# Patient Record
Sex: Female | Born: 1955 | Race: White | Hispanic: No | Marital: Married | Smoking: Never smoker
Health system: Southern US, Community
[De-identification: ages and names within clinical notes are randomized; demographics above are authoritative.]

## PROBLEM LIST (undated history)

## (undated) DIAGNOSIS — J309 Allergic rhinitis, unspecified: Secondary | ICD-10-CM

## (undated) DIAGNOSIS — I10 Essential (primary) hypertension: Secondary | ICD-10-CM

## (undated) DIAGNOSIS — J302 Other seasonal allergic rhinitis: Secondary | ICD-10-CM

## (undated) DIAGNOSIS — Z8639 Personal history of other endocrine, nutritional and metabolic disease: Secondary | ICD-10-CM

## (undated) DIAGNOSIS — E039 Hypothyroidism, unspecified: Secondary | ICD-10-CM

## (undated) HISTORY — PX: TUBAL LIGATION: SHX77

## (undated) HISTORY — PX: DILATION AND CURETTAGE OF UTERUS: SHX78

## (undated) HISTORY — DX: Personal history of other endocrine, nutritional and metabolic disease: Z86.39

## (undated) HISTORY — PX: COLONOSCOPY: SHX174

## (undated) HISTORY — PX: ESOPHAGOGASTRODUODENOSCOPY: SHX1529

## (undated) HISTORY — DX: Hypothyroidism, unspecified: E03.9

## (undated) HISTORY — DX: Allergic rhinitis, unspecified: J30.9

## (undated) HISTORY — PX: ENDOMETRIAL ABLATION: SHX621

## (undated) HISTORY — PX: TONSILLECTOMY AND ADENOIDECTOMY: SUR1326

## (undated) HISTORY — PX: THYROID SURGERY: SHX805

---

## 2010-08-29 ENCOUNTER — Other Ambulatory Visit (HOSPITAL_COMMUNITY): Payer: Self-pay | Admitting: Family Medicine

## 2010-08-29 DIAGNOSIS — Z139 Encounter for screening, unspecified: Secondary | ICD-10-CM

## 2010-09-02 ENCOUNTER — Ambulatory Visit (HOSPITAL_COMMUNITY)
Admission: RE | Admit: 2010-09-02 | Discharge: 2010-09-02 | Disposition: A | Discharge status: Home / Self Care | Payer: BC Managed Care – PPO | Source: Ambulatory Visit | Attending: Family Medicine | Admitting: Family Medicine

## 2010-09-02 DIAGNOSIS — Z1231 Encounter for screening mammogram for malignant neoplasm of breast: Secondary | ICD-10-CM | POA: Insufficient documentation

## 2010-09-02 DIAGNOSIS — Z139 Encounter for screening, unspecified: Secondary | ICD-10-CM

## 2011-09-12 ENCOUNTER — Other Ambulatory Visit: Payer: Self-pay | Admitting: Family Medicine

## 2011-09-12 DIAGNOSIS — Z139 Encounter for screening, unspecified: Secondary | ICD-10-CM

## 2011-09-18 ENCOUNTER — Ambulatory Visit (HOSPITAL_COMMUNITY)
Admission: RE | Admit: 2011-09-18 | Discharge: 2011-09-18 | Disposition: A | Discharge status: Home / Self Care | Payer: BC Managed Care – PPO | Source: Ambulatory Visit | Attending: Family Medicine | Admitting: Family Medicine

## 2011-09-18 DIAGNOSIS — Z1231 Encounter for screening mammogram for malignant neoplasm of breast: Secondary | ICD-10-CM | POA: Insufficient documentation

## 2011-09-18 DIAGNOSIS — Z139 Encounter for screening, unspecified: Secondary | ICD-10-CM

## 2011-09-18 DIAGNOSIS — N6459 Other signs and symptoms in breast: Secondary | ICD-10-CM | POA: Insufficient documentation

## 2011-09-23 ENCOUNTER — Other Ambulatory Visit: Payer: Self-pay | Admitting: Family Medicine

## 2011-09-23 DIAGNOSIS — R928 Other abnormal and inconclusive findings on diagnostic imaging of breast: Secondary | ICD-10-CM

## 2011-10-08 ENCOUNTER — Encounter (HOSPITAL_COMMUNITY): Payer: BC Managed Care – PPO

## 2011-10-15 ENCOUNTER — Ambulatory Visit (HOSPITAL_COMMUNITY)
Admission: RE | Admit: 2011-10-15 | Discharge: 2011-10-15 | Disposition: A | Discharge status: Home / Self Care | Payer: BC Managed Care – PPO | Source: Ambulatory Visit | Attending: Family Medicine | Admitting: Family Medicine

## 2011-10-15 DIAGNOSIS — R928 Other abnormal and inconclusive findings on diagnostic imaging of breast: Secondary | ICD-10-CM | POA: Insufficient documentation

## 2012-09-30 ENCOUNTER — Encounter: Payer: Self-pay | Admitting: Family Medicine

## 2012-09-30 ENCOUNTER — Ambulatory Visit (INDEPENDENT_AMBULATORY_CARE_PROVIDER_SITE_OTHER): Payer: BC Managed Care – PPO | Admitting: Family Medicine

## 2012-09-30 VITALS — Temp 98.6°F | Wt 155.0 lb

## 2012-09-30 DIAGNOSIS — B9789 Other viral agents as the cause of diseases classified elsewhere: Secondary | ICD-10-CM

## 2012-09-30 DIAGNOSIS — B349 Viral infection, unspecified: Secondary | ICD-10-CM

## 2012-09-30 DIAGNOSIS — R300 Dysuria: Secondary | ICD-10-CM

## 2012-09-30 LAB — POCT URINALYSIS DIPSTICK
Spec Grav, UA: 1.01
pH, UA: 7.5

## 2012-09-30 MED ORDER — CIPROFLOXACIN HCL 500 MG PO TABS: 500.0000 mg | ORAL_TABLET | Freq: Two times a day (BID) | ORAL | Status: AC

## 2012-09-30 NOTE — Progress Notes (Signed)
  Subjective:    Patient ID: Katie Ingram, female    DOB: 1955/11/07, 57 y.o.   MRN: [id number]  HPI This patient relates that she has not felt well the past few days has noticed some urinary frequency denies dysuria denies hematuria denies vomiting diarrhea she states started off more as a upper respiratory that she thought was due to allergies slight sinus pressure but nothing severe she denies sweats chills vomiting  PMH, family history of benign   Review of Systemstheplease see above     Objective:   Physical Exam Vital signs noted. No fever. Lungs are clear heart is regular abdomen soft flanks nontender skin warm dry urinalysis negative microscopic negative       Assessment & Plan:  Viral syndrome-I doubt UTI. Her specimen was. Small the dipstick was negative not enough to do a culture. I did give her a prescription of Cipro 500 twice a day for 5 days if she has ongoing symptoms of a UTI if fevers vomiting bloody stools or other problems immediately call us. Allergic rhinitis use current medications that seem to be doing an adequate job.

## 2012-10-06 ENCOUNTER — Other Ambulatory Visit: Payer: Self-pay | Admitting: Family Medicine

## 2012-11-06 ENCOUNTER — Encounter: Payer: Self-pay | Admitting: *Deleted

## 2012-11-08 ENCOUNTER — Ambulatory Visit (INDEPENDENT_AMBULATORY_CARE_PROVIDER_SITE_OTHER): Payer: BC Managed Care – PPO | Admitting: Nurse Practitioner

## 2012-11-08 ENCOUNTER — Other Ambulatory Visit: Payer: Self-pay | Admitting: Family Medicine

## 2012-11-08 ENCOUNTER — Encounter: Payer: Self-pay | Admitting: Nurse Practitioner

## 2012-11-08 VITALS — BP 122/80 | HR 70 | Ht 66.5 in | Wt 154.5 lb

## 2012-11-08 DIAGNOSIS — E039 Hypothyroidism, unspecified: Secondary | ICD-10-CM | POA: Insufficient documentation

## 2012-11-08 DIAGNOSIS — Z01419 Encounter for gynecological examination (general) (routine) without abnormal findings: Secondary | ICD-10-CM

## 2012-11-08 DIAGNOSIS — Z0000 Encounter for general adult medical examination without abnormal findings: Secondary | ICD-10-CM

## 2012-11-08 DIAGNOSIS — Z139 Encounter for screening, unspecified: Secondary | ICD-10-CM

## 2012-11-08 DIAGNOSIS — Z13220 Encounter for screening for lipoid disorders: Secondary | ICD-10-CM

## 2012-11-08 NOTE — Progress Notes (Signed)
Mammogram scheduled for November 09, 2012 at 1:10 pm. Patient notified.

## 2012-11-08 NOTE — Progress Notes (Signed)
  Subjective:    Patient ID: Katie Ingram, female    DOB: [birth year], 56 y.o.   MRN: [id number]  HPI presents for her wellness checkup. No vaginal bleeding. Married, same sexual partner. No vaginal discharge. No pelvic pain. Gets regular eye exams. Regular dental exams. Has had several colonoscopies due to her family history, they have all been normal. Was told after the last one that she would be another one in 10 years. Unfortunately is not sure about what year this was done. Is sure that this is been done within the past 10 years.    Review of Systems  Constitutional: Negative for activity change, appetite change and fatigue.  Eyes: Negative for visual disturbance.  Respiratory: Negative for chest tightness and shortness of breath.   Cardiovascular: Negative for chest pain and palpitations.  Gastrointestinal: Negative for vomiting, abdominal pain, diarrhea, constipation, blood in stool, abdominal distention and rectal pain.  Genitourinary: Negative for dysuria, urgency, frequency, vaginal discharge, difficulty urinating, menstrual problem and pelvic pain.  Psychiatric/Behavioral: Negative for sleep disturbance.       Objective:   Physical Exam  Constitutional: She is oriented to person, place, and time. She appears well-developed. No distress.  HENT:  Right Ear: External ear normal.  Left Ear: External ear normal.  Mouth/Throat: Oropharynx is clear and moist.  Neck: Normal range of motion. Neck supple. No tracheal deviation present. No thyromegaly present.  Cardiovascular: Normal rate, regular rhythm and normal heart sounds.  Exam reveals no gallop.   No murmur heard. Pulmonary/Chest: Effort normal and breath sounds normal.  Abdominal: Soft. She exhibits no distension. There is no tenderness.  Genitourinary: Uterus normal. No vaginal discharge found.  Musculoskeletal: She exhibits no edema.  Lymphadenopathy:    She has no cervical adenopathy.  Neurological: She is alert and  oriented to person, place, and time.  Skin: Skin is warm and dry. No rash noted.  Psychiatric: She has a normal mood and affect. Her behavior is normal.   Breast exam minimal fine nodularity, no dominant masses. Axilla no adenopathy. External GU normal limit for age. Bimanual exam normal, no obvious masses or tenderness. Rectal exam normal, no stool for Hemoccult. Thyroid normal limit to palpation, left side surgically absent.        Assessment & Plan:  Well woman exam  Routine general medical examination at a health care facility - Plan: Basic metabolic panel, Hepatic function panel, Vitamin D 25 hydroxy, Basic metabolic panel, Hepatic function panel, Vitamin D 25 hydroxy, POC Hemoccult Bld/Stl (3-Cd Home Screen)  Hypothyroidism - Plan: TSH, TSH  Need for lipid screening - Plan: Lipid panel, Lipid panel  Mammogram scheduled. Discussed importance of healthy diet and regular activity. Also recommend calcium and vitamin D supplementation. Next physical in one year.

## 2012-11-08 NOTE — Assessment & Plan Note (Signed)
TSH pending. 

## 2012-11-08 NOTE — Patient Instructions (Signed)
Katie Ingram 2-3 x per week for vaginal heatlh

## 2012-11-09 ENCOUNTER — Ambulatory Visit (HOSPITAL_COMMUNITY)
Admission: RE | Admit: 2012-11-09 | Discharge: 2012-11-09 | Disposition: A | Discharge status: Home / Self Care | Payer: BC Managed Care – PPO | Source: Ambulatory Visit | Attending: Family Medicine | Admitting: Family Medicine

## 2012-11-09 DIAGNOSIS — Z1231 Encounter for screening mammogram for malignant neoplasm of breast: Secondary | ICD-10-CM | POA: Insufficient documentation

## 2012-11-09 DIAGNOSIS — Z139 Encounter for screening, unspecified: Secondary | ICD-10-CM

## 2012-11-09 LAB — BASIC METABOLIC PANEL WITH GFR
BUN: 15 mg/dL (ref 6–23)
CO2: 29 meq/L (ref 19–32)
Calcium: 9.4 mg/dL (ref 8.4–10.5)
Chloride: 102 meq/L (ref 96–112)
Creat: 0.76 mg/dL (ref 0.50–1.10)
Glucose, Bld: 97 mg/dL (ref 70–99)
Potassium: 4.4 meq/L (ref 3.5–5.3)
Sodium: 142 meq/L (ref 135–145)

## 2012-11-09 LAB — HEPATIC FUNCTION PANEL
ALT: 32 U/L (ref 0–35)
Bilirubin, Direct: 0.1 mg/dL (ref 0.0–0.3)
Indirect Bilirubin: 0.5 mg/dL (ref 0.0–0.9)

## 2012-11-09 LAB — TSH: TSH: 2.696 u[IU]/mL (ref 0.350–4.500)

## 2012-11-09 LAB — LIPID PANEL
Cholesterol: 235 mg/dL — ABNORMAL HIGH (ref 0–200)
HDL: 60 mg/dL (ref >39)
LDL Cholesterol: 153 mg/dL — ABNORMAL HIGH (ref 0–99)
Triglycerides: 112 mg/dL (ref <150)
VLDL: 22 mg/dL (ref 0–40)

## 2012-11-10 ENCOUNTER — Encounter: Payer: Self-pay | Admitting: Nurse Practitioner

## 2012-11-30 ENCOUNTER — Other Ambulatory Visit (INDEPENDENT_AMBULATORY_CARE_PROVIDER_SITE_OTHER): Payer: BC Managed Care – PPO | Admitting: *Deleted

## 2012-11-30 DIAGNOSIS — Z0000 Encounter for general adult medical examination without abnormal findings: Secondary | ICD-10-CM

## 2012-11-30 LAB — POC HEMOCCULT BLD/STL (HOME/3-CARD/SCREEN): Card #3 Fecal Occult Blood, POC: NEGATIVE

## 2013-01-03 ENCOUNTER — Telehealth: Payer: Self-pay | Admitting: Family Medicine

## 2013-01-03 NOTE — Telephone Encounter (Signed)
Patient would like to know the results of the hemacult that she dropped off in June

## 2013-01-03 NOTE — Telephone Encounter (Signed)
Results were negative. Patient was notified

## 2013-01-25 ENCOUNTER — Telehealth: Payer: Self-pay | Admitting: Family Medicine

## 2013-01-25 NOTE — Telephone Encounter (Signed)
Patient would like to get a refill on her potassium because she has started a running program. Katie Ingram  Please call patient when complete

## 2013-01-25 NOTE — Telephone Encounter (Signed)
Patient is not currently on potassium and would need a rx for it. Katie Ingram it was good for runners

## 2013-01-25 NOTE — Telephone Encounter (Signed)
Pt's potassium was right in the middle of normal limits at 4.4 in June. Rx potassium not warranted, can increase potassium rich foods--oj, butter beans, bananas

## 2013-01-25 NOTE — Telephone Encounter (Signed)
Left message to return call 

## 2013-01-25 NOTE — Telephone Encounter (Signed)
Discussed with patient. Patient verbalized understanding. 

## 2013-02-15 ENCOUNTER — Other Ambulatory Visit: Payer: Self-pay | Admitting: Family Medicine

## 2013-02-27 ENCOUNTER — Other Ambulatory Visit: Payer: Self-pay | Admitting: Family Medicine

## 2013-05-25 ENCOUNTER — Encounter: Payer: Self-pay | Admitting: Family Medicine

## 2013-05-25 ENCOUNTER — Ambulatory Visit (INDEPENDENT_AMBULATORY_CARE_PROVIDER_SITE_OTHER): Payer: BC Managed Care – PPO | Admitting: Family Medicine

## 2013-05-25 VITALS — BP 136/90 | Ht 68.0 in | Wt 155.4 lb

## 2013-05-25 DIAGNOSIS — J019 Acute sinusitis, unspecified: Secondary | ICD-10-CM

## 2013-05-25 MED ORDER — AMOXICILLIN-POT CLAVULANATE 875-125 MG PO TABS: 1.0000 | ORAL_TABLET | Freq: Two times a day (BID) | ORAL | Status: AC

## 2013-05-25 NOTE — Progress Notes (Signed)
   Subjective:    Patient ID: Katie Ingram, female    DOB: 1956-03-19, 57 y.o.   MRN: [id number]  Sinusitis This is a new problem. The current episode started 1 to 4 weeks ago. The problem has been waxing and waning since onset. There has been no fever. Associated symptoms include chills, congestion, coughing, headaches and sinus pressure. Past treatments include oral decongestants. The treatment provided mild relief.   Been present for 3 weeks. Lingered. Worse past 7 days.pressure r side and headache. Hot and chills. occas nausea.   Review of Systems  Constitutional: Positive for chills.  HENT: Positive for congestion and sinus pressure.   Respiratory: Positive for cough.   Neurological: Positive for headaches.       Objective:   Physical Exam  Nursing note and vitals reviewed. Constitutional: She appears well-developed.  HENT:  Head: Normocephalic.  Nose: Nose normal.  Mouth/Throat: Oropharynx is clear and moist. No oropharyngeal exudate.  Neck: Neck supple.  Cardiovascular: Normal rate and normal heart sounds.   No murmur heard. Pulmonary/Chest: Effort normal and breath sounds normal. She has no wheezes.  Lymphadenopathy:    She has no cervical adenopathy.  Skin: Skin is warm and dry.          Assessment & Plan:  Sinusitis atx prescribed warni ngs given

## 2013-06-10 ENCOUNTER — Other Ambulatory Visit: Payer: Self-pay | Admitting: Family Medicine

## 2013-09-04 ENCOUNTER — Other Ambulatory Visit: Payer: Self-pay | Admitting: Family Medicine

## 2013-09-20 ENCOUNTER — Other Ambulatory Visit: Payer: Self-pay | Admitting: Family Medicine

## 2013-12-02 ENCOUNTER — Ambulatory Visit (INDEPENDENT_AMBULATORY_CARE_PROVIDER_SITE_OTHER): Payer: BC Managed Care – PPO | Admitting: Nurse Practitioner

## 2013-12-02 ENCOUNTER — Encounter: Payer: Self-pay | Admitting: Nurse Practitioner

## 2013-12-02 VITALS — BP 130/82 | Ht 65.5 in | Wt 148.6 lb

## 2013-12-02 DIAGNOSIS — Z0000 Encounter for general adult medical examination without abnormal findings: Secondary | ICD-10-CM

## 2013-12-02 DIAGNOSIS — E039 Hypothyroidism, unspecified: Secondary | ICD-10-CM

## 2013-12-02 DIAGNOSIS — Z01419 Encounter for gynecological examination (general) (routine) without abnormal findings: Secondary | ICD-10-CM

## 2013-12-02 MED ORDER — NABUMETONE 500 MG PO TABS: 500.0000 mg | ORAL_TABLET | Freq: Two times a day (BID) | ORAL | Status: DC | PRN

## 2013-12-05 ENCOUNTER — Other Ambulatory Visit: Payer: Self-pay | Admitting: Family Medicine

## 2013-12-05 DIAGNOSIS — Z139 Encounter for screening, unspecified: Secondary | ICD-10-CM

## 2013-12-06 ENCOUNTER — Other Ambulatory Visit: Payer: Self-pay | Admitting: *Deleted

## 2013-12-06 DIAGNOSIS — Z01419 Encounter for gynecological examination (general) (routine) without abnormal findings: Secondary | ICD-10-CM

## 2013-12-06 LAB — POC HEMOCCULT BLD/STL (HOME/3-CARD/SCREEN)
FECAL OCCULT BLD: NEGATIVE
FECAL OCCULT BLD: NEGATIVE
FECAL OCCULT BLD: NEGATIVE

## 2013-12-07 ENCOUNTER — Encounter: Payer: Self-pay | Admitting: Nurse Practitioner

## 2013-12-07 LAB — TSH: TSH: 1.263 u[IU]/mL (ref 0.350–4.500)

## 2013-12-07 NOTE — Progress Notes (Signed)
   Subjective:    Patient ID: Katie Ingram, female    DOB: [birth year], 58 y.o.   MRN: [id number]  HPI presents for her wellness checkup. Regular walking program. Has lost 10 lbs. Plans to schedule her own mammogram. Has had endometrial ablation, no bleeding. Takes MVI daily. Regular vision and dental exams. Same sexual partner.    Review of Systems  Constitutional: Negative for fever, activity change, appetite change and fatigue.  HENT: Negative for dental problem, ear pain, sinus pressure and sore throat.   Respiratory: Negative for cough, chest tightness, shortness of breath and wheezing.   Cardiovascular: Negative for chest pain and leg swelling.  Gastrointestinal: Negative for nausea, vomiting, abdominal pain, diarrhea, constipation, blood in stool and abdominal distention.  Genitourinary: Negative for dysuria, urgency, frequency, vaginal bleeding, vaginal discharge, enuresis, difficulty urinating, genital sores and pelvic pain.  Musculoskeletal: Positive for arthralgias.       Objective:   Physical Exam  Vitals reviewed. Constitutional: She is oriented to person, place, and time. She appears well-developed. No distress.  HENT:  Right Ear: External ear normal.  Left Ear: External ear normal.  Mouth/Throat: Oropharynx is clear and moist.  Neck: Normal range of motion. Neck supple. No tracheal deviation present. No thyromegaly present.  Cardiovascular: Normal rate, regular rhythm and normal heart sounds.  Exam reveals no gallop.   No murmur heard. Pulmonary/Chest: Effort normal and breath sounds normal.  Abdominal: Soft. She exhibits no distension. There is no tenderness.  Genitourinary: Vagina normal and uterus normal. No vaginal discharge found.  External GU: no rashes or lesions; vagina: no discharge; bimanual exam: no tenderness or obvious masses. Rectal exam: no masses; no stool for hemoccult  Musculoskeletal: She exhibits no edema.  Lymphadenopathy:    She has no cervical  adenopathy.  Neurological: She is alert and oriented to person, place, and time.  Skin: Skin is warm and dry. No rash noted.  Psychiatric: She has a normal mood and affect. Her behavior is normal.          Assessment & Plan:  Well woman exam - Plan: POC Hemoccult Bld/Stl (3-Cd Home Screen)  Hypothyroidism, unspecified hypothyroidism type - Plan: TSH  Meds ordered this encounter  Medications  . nabumetone (RELAFEN) 500 MG tablet    Sig: Take 1 tablet (500 mg total) by mouth 2 (two) times daily as needed. For arthritis pain    Dispense:  30 tablet    Refill:  0    Order Specific Question:  Supervising Provider    Answer:  Mikey Kirschner [[id number]]   Recommend healthy diet, regular activity and vitamin D/calcium supplementation. Next PE in one year.

## 2013-12-08 ENCOUNTER — Ambulatory Visit (HOSPITAL_COMMUNITY): Payer: BC Managed Care – PPO

## 2013-12-15 ENCOUNTER — Telehealth: Payer: Self-pay | Admitting: Nurse Practitioner

## 2013-12-15 NOTE — Telephone Encounter (Signed)
Patient said that she is satisfied with the nabumetone (RELAFEN) 500 MG tablet and it helped her tremendously. She would like a refill sent in to [facility] for 90 day supply and since she is almost out she wants a small Rx for this to [facility].   [facility]

## 2013-12-16 ENCOUNTER — Other Ambulatory Visit: Payer: Self-pay | Admitting: Nurse Practitioner

## 2013-12-16 MED ORDER — NABUMETONE 500 MG PO TABS: ORAL_TABLET | ORAL | Status: DC

## 2013-12-16 NOTE — Telephone Encounter (Signed)
Orders sent in as requested

## 2013-12-16 NOTE — Telephone Encounter (Signed)
Discussed with patient

## 2013-12-23 ENCOUNTER — Ambulatory Visit (HOSPITAL_COMMUNITY)
Admission: RE | Admit: 2013-12-23 | Discharge: 2013-12-23 | Disposition: A | Discharge status: Home / Self Care | Payer: BC Managed Care – PPO | Source: Ambulatory Visit | Attending: Family Medicine | Admitting: Family Medicine

## 2013-12-23 DIAGNOSIS — Z1231 Encounter for screening mammogram for malignant neoplasm of breast: Secondary | ICD-10-CM | POA: Insufficient documentation

## 2013-12-23 DIAGNOSIS — Z139 Encounter for screening, unspecified: Secondary | ICD-10-CM

## 2014-02-04 ENCOUNTER — Other Ambulatory Visit: Payer: Self-pay | Admitting: Family Medicine

## 2014-02-22 ENCOUNTER — Ambulatory Visit (INDEPENDENT_AMBULATORY_CARE_PROVIDER_SITE_OTHER): Payer: BC Managed Care – PPO | Admitting: Family Medicine

## 2014-02-22 ENCOUNTER — Encounter: Payer: Self-pay | Admitting: Family Medicine

## 2014-02-22 VITALS — BP 138/88 | Temp 99.0°F | Ht 65.5 in | Wt 152.0 lb

## 2014-02-22 DIAGNOSIS — J329 Chronic sinusitis, unspecified: Secondary | ICD-10-CM

## 2014-02-22 MED ORDER — AMOXICILLIN-POT CLAVULANATE 875-125 MG PO TABS: 1.0000 | ORAL_TABLET | Freq: Two times a day (BID) | ORAL | Status: AC

## 2014-02-22 NOTE — Progress Notes (Signed)
   Subjective:    Patient ID: Katie Ingram, female    DOB: [birth year], 57 y.o.   MRN: [id number]  Sinus Problem This is a new problem. The current episode started 1 to 4 weeks ago. The problem has been gradually worsening since onset. Maximum temperature: Fever 99.0. Her pain is at a severity of 4/10. The pain is mild. Associated symptoms include chills and headaches. Past treatments include oral decongestants. The treatment provided no relief.   Fall allergies  Constant drainage and congestion and stuffiness  Head uncomfortable and congesgted and sinus headache  scrtchy throat didn't feel well at all   Ear and sinus cong and prod cough  Saline spray twic e per d      Review of Systems  Constitutional: Positive for chills.  Neurological: Positive for headaches.       Objective:   Physical Exam  Alert vitals stable. Mild malaise. Lungs clear. Heart regular in rhythm. Frontal maxillary tenderness. Left ear effusion noted. Neck supple.      Assessment & Plan:  Impression allergic rhinitis with now secondary rhinosinusitis. Also exposed to virus plan antibiotics prescribed. Symptomatic care discussed. Warning signs discussed. WSL

## 2014-02-23 ENCOUNTER — Encounter: Payer: Self-pay | Admitting: Family Medicine

## 2014-03-01 ENCOUNTER — Other Ambulatory Visit: Payer: Self-pay | Admitting: Family Medicine

## 2014-03-01 ENCOUNTER — Telehealth: Payer: Self-pay | Admitting: Family Medicine

## 2014-03-01 MED ORDER — PREDNISONE 20 MG PO TABS: ORAL_TABLET | ORAL | Status: DC

## 2014-03-01 NOTE — Telephone Encounter (Signed)
Seen sept 16th. Dx with allergic rhinitis secondary rhinosinusitis. Prescribed augmentin BID for 10 days. Still having headaches, sinus pressure, runny nose - clear. No wheezing, no cough, no fever. Pt requesting prednisone. She states this is usually the only thing that helps. [facility]

## 2014-03-01 NOTE — Telephone Encounter (Signed)
Prednisone, 20 mg, #18,3qd for 3d then 2qd for 3d then 1qd for 3d  

## 2014-03-01 NOTE — Telephone Encounter (Signed)
Was seen by Dr Richardson Landry 16th of Sept, she does not feel any better.  Still stuffy headed, headaches, sinus pressure   States she usually ends up on a prednisone pack when this happens  [employer]

## 2014-03-01 NOTE — Telephone Encounter (Signed)
Med sent to pharm. Pt notified.  

## 2014-04-04 ENCOUNTER — Telehealth: Payer: Self-pay | Admitting: Family Medicine

## 2014-04-04 ENCOUNTER — Other Ambulatory Visit: Payer: Self-pay | Admitting: Nurse Practitioner

## 2014-04-04 MED ORDER — NABUMETONE 500 MG PO TABS: ORAL_TABLET | ORAL | Status: DC

## 2014-04-04 NOTE — Telephone Encounter (Signed)
nabumetone (RELAFEN) 500 MG tablet   Pt needs refill sent to [facility] for 30 days    As well as 90 days through mail order to [place]  She is out as of today, thought she had another refill on it but does not

## 2014-04-04 NOTE — Telephone Encounter (Signed)
Both were sent in as requested

## 2014-04-04 NOTE — Telephone Encounter (Signed)
Patient notified

## 2014-06-29 ENCOUNTER — Ambulatory Visit (INDEPENDENT_AMBULATORY_CARE_PROVIDER_SITE_OTHER): Payer: BLUE CROSS/BLUE SHIELD | Admitting: Family Medicine

## 2014-06-29 ENCOUNTER — Encounter: Payer: Self-pay | Admitting: Family Medicine

## 2014-06-29 VITALS — BP 110/70 | Temp 98.6°F | Ht 65.5 in | Wt 158.4 lb

## 2014-06-29 DIAGNOSIS — J019 Acute sinusitis, unspecified: Secondary | ICD-10-CM

## 2014-06-29 MED ORDER — AMOXICILLIN-POT CLAVULANATE 875-125 MG PO TABS: 1.0000 | ORAL_TABLET | Freq: Two times a day (BID) | ORAL | Status: DC

## 2014-06-29 MED ORDER — HYDROCODONE-HOMATROPINE 5-1.5 MG/5ML PO SYRP: ORAL_SOLUTION | ORAL | Status: DC

## 2014-06-29 NOTE — Progress Notes (Signed)
   Subjective:    Patient ID: Katie Ingram, female    DOB: Dec 24, 1955, 59 y.o.   MRN: [id number]  Sinusitis This is a new problem. The current episode started in the past 7 days. The problem has been gradually worsening since onset. There has been no fever. The pain is moderate. Associated symptoms include congestion and coughing. Past treatments include oral decongestants. The treatment provided no relief.   Patient states that she has no other concerns at this time.   Dry scratchy throat  Sinus drainage and cough stated   Mod prod  No fever  Cough any old time  Cough irrit throat not bad coughing,              Review of Systems  HENT: Positive for congestion.   Respiratory: Positive for cough.    no vomiting no diarrhea no rash     Objective:   Physical Exam Alert hydration good. Mild malaise. H&T moderate his congestion frontal tenderness. Pharynx normal neck supple. Lungs clear heart regular in rhythm.       Assessment & Plan:  Impression acute rhinosinusitis plan since Medicare discussed. Warning signs discussed. Antibiotics initiated. WSL

## 2014-07-07 ENCOUNTER — Other Ambulatory Visit: Payer: Self-pay | Admitting: Family Medicine

## 2014-08-11 ENCOUNTER — Other Ambulatory Visit: Payer: Self-pay | Admitting: Nurse Practitioner

## 2014-10-16 ENCOUNTER — Other Ambulatory Visit: Payer: Self-pay | Admitting: Family Medicine

## 2014-10-24 ENCOUNTER — Ambulatory Visit (INDEPENDENT_AMBULATORY_CARE_PROVIDER_SITE_OTHER): Payer: BLUE CROSS/BLUE SHIELD | Admitting: Family Medicine

## 2014-10-24 ENCOUNTER — Encounter: Payer: Self-pay | Admitting: Family Medicine

## 2014-10-24 VITALS — BP 116/82 | Temp 98.6°F | Ht 65.5 in | Wt 155.2 lb

## 2014-10-24 DIAGNOSIS — J329 Chronic sinusitis, unspecified: Secondary | ICD-10-CM

## 2014-10-24 MED ORDER — MONTELUKAST SODIUM 10 MG PO TABS: 10.0000 mg | ORAL_TABLET | Freq: Every day | ORAL | Status: DC

## 2014-10-24 MED ORDER — AMOXICILLIN-POT CLAVULANATE 875-125 MG PO TABS: 1.0000 | ORAL_TABLET | Freq: Two times a day (BID) | ORAL | Status: AC

## 2014-10-24 NOTE — Progress Notes (Signed)
   Subjective:    Patient ID: Katie Ingram, female    DOB: 1956/02/12, 59 y.o.   MRN: [id number]  HPI Patient here due to sinus pressure and drainage after 1 week of cold-like symptoms.   Onset about eleven days ago. Denies any fever. OTC meds not helpful.   Sinus pressure and headache , headache frontal study worse with changing to pressure  And dim energy   Review of Systems Occasional cough slight diminished energy    Objective:   Physical Exam Vitals stable mild malaise HET moderate nasal congestion frontal tenderness pharynx erythematous neck supple. Lungs clear. Heart rare rhythm.       Assessment & Plan:  Impression post viral rhinosinusitis plan antibiotics prescribed. Symptom care discussed. Warning signs discussed. Singulair refilled WSL

## 2014-11-22 ENCOUNTER — Other Ambulatory Visit: Payer: Self-pay | Admitting: Family Medicine

## 2014-11-22 MED ORDER — MONTELUKAST SODIUM 10 MG PO TABS: 10.0000 mg | ORAL_TABLET | Freq: Every day | ORAL | Status: DC

## 2014-11-22 NOTE — Telephone Encounter (Signed)
Notified patient that med was sent to pharmacy.  

## 2014-11-22 NOTE — Telephone Encounter (Signed)
Patient wants prescription called into [facility] for 90 day supply on singular 10 mg. [phone])

## 2015-01-05 ENCOUNTER — Telehealth: Payer: Self-pay | Admitting: Family Medicine

## 2015-01-05 MED ORDER — LEVOTHYROXINE SODIUM 50 MCG PO TABS: 50.0000 ug | ORAL_TABLET | Freq: Every day | ORAL | Status: DC

## 2015-01-05 NOTE — Telephone Encounter (Signed)
Pt is needing a refill on her synthroid pt needs some sent to [facility] to last her till her mail order can come in. Pt will also need 90 day supplies sent in to [facility]

## 2015-01-05 NOTE — Telephone Encounter (Signed)
Notified patient 30 day supply was sent to [facility] and she needs an office visit before any further refills. Patient verbalized understanding.

## 2015-01-26 ENCOUNTER — Ambulatory Visit (INDEPENDENT_AMBULATORY_CARE_PROVIDER_SITE_OTHER): Payer: BLUE CROSS/BLUE SHIELD | Admitting: Family Medicine

## 2015-01-26 VITALS — BP 124/86 | Ht 65.5 in | Wt 155.4 lb

## 2015-01-26 DIAGNOSIS — M549 Dorsalgia, unspecified: Secondary | ICD-10-CM | POA: Diagnosis not present

## 2015-01-26 DIAGNOSIS — M25551 Pain in right hip: Secondary | ICD-10-CM

## 2015-01-26 MED ORDER — DICLOFENAC SODIUM 75 MG PO TBEC: DELAYED_RELEASE_TABLET | ORAL | Status: DC

## 2015-01-26 NOTE — Progress Notes (Signed)
   Subjective:    Patient ID: Katie Ingram, female    DOB: [birth year], 59 y.o.   MRN: [id number]  Fall The accident occurred more than 1 week ago. The fall occurred from a stool. She fell from a height of 1 to 2 ft. Impact surface: Bricks. There was no blood loss. Point of impact: Right leg, Right arm. The pain is present in the back and right upper leg. The pain is mild. The symptoms are aggravated by flexion, movement and ambulation (Ambulation on incline). She has tried NSAID (Ibuprofen) for the symptoms. The treatment provided mild relief.   Patient states no other concerns this visit. Patient notes right anterior hip pain. Worse with climbing hills etc. Also low lumbar pain. Comes and goes achy sharp in nature. Takes Relafen on a regular basis for chronic arthritis. No change in urinary habits. No major radiation down leg.  Review of Systems No headache no chest pain no cough originally bilateral elbow contusion now improved    Objective:   Physical Exam Alert vitals stable lungs clear. Heart rare rhythm. Hip good range of motion some slight pain with internal and external rotation slight lateral trochanteric and right lumbar tenderness to deep palpation       Assessment & Plan:  Impression multiple strains post fall. Highly doubt fracture discussed plan gradual ambulation. Exercise discussed. Local measures discussed. Full tear and twice a day with food next 10 days thinking go back to Relafen WSL

## 2015-02-05 ENCOUNTER — Telehealth: Payer: Self-pay | Admitting: Family Medicine

## 2015-02-05 MED ORDER — LEVOTHYROXINE SODIUM 50 MCG PO TABS: 50.0000 ug | ORAL_TABLET | Freq: Every day | ORAL | Status: DC

## 2015-02-05 NOTE — Telephone Encounter (Signed)
Rx sent electronically to pharmacy. Patient notified. 

## 2015-02-05 NOTE — Telephone Encounter (Signed)
Pt is needing a refill on her synthroid to last her to her appt in sept.  Katie Ingram

## 2015-02-08 ENCOUNTER — Encounter: Payer: Self-pay | Admitting: Family Medicine

## 2015-02-08 ENCOUNTER — Ambulatory Visit (HOSPITAL_COMMUNITY)
Admission: RE | Admit: 2015-02-08 | Discharge: 2015-02-08 | Disposition: A | Discharge status: Home / Self Care | Payer: BLUE CROSS/BLUE SHIELD | Source: Ambulatory Visit | Attending: Family Medicine | Admitting: Family Medicine

## 2015-02-08 ENCOUNTER — Ambulatory Visit (INDEPENDENT_AMBULATORY_CARE_PROVIDER_SITE_OTHER): Payer: BLUE CROSS/BLUE SHIELD | Admitting: Family Medicine

## 2015-02-08 VITALS — BP 138/90 | Ht 65.5 in | Wt 159.0 lb

## 2015-02-08 DIAGNOSIS — M25551 Pain in right hip: Secondary | ICD-10-CM

## 2015-02-08 DIAGNOSIS — R102 Pelvic and perineal pain: Secondary | ICD-10-CM | POA: Diagnosis not present

## 2015-02-08 DIAGNOSIS — M25552 Pain in left hip: Secondary | ICD-10-CM | POA: Diagnosis not present

## 2015-02-08 DIAGNOSIS — M1990 Unspecified osteoarthritis, unspecified site: Secondary | ICD-10-CM | POA: Insufficient documentation

## 2015-02-08 MED ORDER — CYCLOBENZAPRINE HCL 10 MG PO TABS: 10.0000 mg | ORAL_TABLET | Freq: Two times a day (BID) | ORAL | Status: DC | PRN

## 2015-02-08 MED ORDER — DICLOFENAC SODIUM 75 MG PO TBEC: DELAYED_RELEASE_TABLET | ORAL | Status: DC

## 2015-02-08 NOTE — Progress Notes (Signed)
   Subjective:    Patient ID: Katie Ingram, female    DOB: [birth year], 59 y.o.   MRN: [id number]  Back Pain Episode onset: fell off a ladder 5 weeks ago. Radiates to: right hip, leg, groin pain. Treatments tried: valium, tramadol, voltaren.     persistent pain now 5 weeks post accident. Stepped through the space of a ladder fell to the ground twisting sensation right hip. Now ongoing pain. Comes and goes. Some days worse than others. Worse when having to walk up an incline. Awfully painful yesterday took of Valium with some improvement  Review of Systems  Musculoskeletal: Positive for back pain.       Objective:   Physical Exam   alert vital stable lungs clear heart rare rhythm right hip good range of motion. Some anterior joint line tenderness to deep palpation no spinal tenderness no negative straight leg raise      Assessment & Plan:   impression persistent hip pain following injury plan x-rays. Maintain anti-inflammatory. Add Flexeril when necessary. If persists would recommend orthopedic visit WSL

## 2015-02-09 ENCOUNTER — Telehealth: Payer: Self-pay | Admitting: Family Medicine

## 2015-02-09 NOTE — Telephone Encounter (Signed)
Notify pt xrays normal no acute abnormality no fx's

## 2015-02-09 NOTE — Telephone Encounter (Signed)
Results discussed with patient. Patient advised her xrays were normal no acute abnormality no fx's. Patient verbalized understanding.

## 2015-02-09 NOTE — Telephone Encounter (Signed)
Calling to get results to hip x-ray.

## 2015-02-13 ENCOUNTER — Other Ambulatory Visit: Payer: Self-pay | Admitting: *Deleted

## 2015-02-13 ENCOUNTER — Telehealth: Payer: Self-pay | Admitting: Family Medicine

## 2015-02-13 DIAGNOSIS — M25551 Pain in right hip: Secondary | ICD-10-CM

## 2015-02-13 NOTE — Telephone Encounter (Signed)
Right hip MRI persist pain post fall, and referral to dr Katie Ingram lz do both

## 2015-02-13 NOTE — Telephone Encounter (Signed)
MRI scheduled [facility] sept 15th 3pm register 2:45. Order for referral put in. Pt notified on voicemail.

## 2015-02-13 NOTE — Telephone Encounter (Signed)
Pt wants to go to [facility]. Can go wed or Thursday after 3pm or anytime on Friday. Leave message about appt on voicemail

## 2015-02-13 NOTE — Telephone Encounter (Signed)
Pt called stating that she is still having back pain. Pt also states that Dr. Richardson Landry stated that if the xray didn't show anything and she was still having pain then he would order an MRI. Pt would like for that to be scheduled any day but today.

## 2015-02-13 NOTE — Telephone Encounter (Signed)
[facility] what location does she want scan at. Pt works in [employer]

## 2015-02-20 ENCOUNTER — Ambulatory Visit (INDEPENDENT_AMBULATORY_CARE_PROVIDER_SITE_OTHER): Payer: BLUE CROSS/BLUE SHIELD | Admitting: Nurse Practitioner

## 2015-02-20 ENCOUNTER — Other Ambulatory Visit: Payer: Self-pay | Admitting: Nurse Practitioner

## 2015-02-20 ENCOUNTER — Encounter: Payer: Self-pay | Admitting: Nurse Practitioner

## 2015-02-20 ENCOUNTER — Other Ambulatory Visit: Payer: Self-pay | Admitting: Family Medicine

## 2015-02-20 VITALS — BP 120/78 | Ht 65.5 in | Wt 157.6 lb

## 2015-02-20 DIAGNOSIS — Z124 Encounter for screening for malignant neoplasm of cervix: Secondary | ICD-10-CM | POA: Diagnosis not present

## 2015-02-20 DIAGNOSIS — Z1231 Encounter for screening mammogram for malignant neoplasm of breast: Secondary | ICD-10-CM

## 2015-02-20 DIAGNOSIS — Z01419 Encounter for gynecological examination (general) (routine) without abnormal findings: Secondary | ICD-10-CM

## 2015-02-20 DIAGNOSIS — E039 Hypothyroidism, unspecified: Secondary | ICD-10-CM | POA: Diagnosis not present

## 2015-02-20 DIAGNOSIS — Z1151 Encounter for screening for human papillomavirus (HPV): Secondary | ICD-10-CM

## 2015-02-20 DIAGNOSIS — Z0000 Encounter for general adult medical examination without abnormal findings: Secondary | ICD-10-CM | POA: Diagnosis not present

## 2015-02-20 NOTE — Progress Notes (Signed)
   Subjective:    Patient ID: Katie Ingram, female    DOB: [birth year], 59 y.o.   MRN: [id number]  HPI presents for her wellness exam. History of ablation; no vaginal bleeding. Same sexual partner. Limited exercise for a few weeks due to injury which has greatly improved. Has resumed almost all of her activities. Would like to cancel MRI and ortho referral. Regular vision and dental exams. Takes daily MVI with vitamin D and calcium.     Review of Systems  Constitutional: Negative for activity change, appetite change and fatigue.  HENT: Negative for dental problem, ear pain, sinus pressure and sore throat.   Respiratory: Negative for cough, chest tightness, shortness of breath and wheezing.   Cardiovascular: Negative for chest pain.  Gastrointestinal: Negative for nausea, vomiting, abdominal pain, diarrhea, constipation, blood in stool and abdominal distention.  Genitourinary: Negative for dysuria, urgency, frequency, vaginal bleeding, vaginal discharge, enuresis, difficulty urinating, genital sores and pelvic pain.       Objective:   Physical Exam  Constitutional: She is oriented to person, place, and time. She appears well-developed. No distress.  HENT:  Right Ear: External ear normal.  Left Ear: External ear normal.  Mouth/Throat: Oropharynx is clear and moist.  Neck: Normal range of motion. Neck supple. No tracheal deviation present. No thyromegaly present.  Left side of thyroid absent. Right side: no masses or goiter; non tender.   Cardiovascular: Normal rate, regular rhythm and normal heart sounds.  Exam reveals no gallop.   No murmur heard. Pulmonary/Chest: Effort normal and breath sounds normal.  Abdominal: Soft. She exhibits no distension. There is no tenderness.  Genitourinary: Vagina normal and uterus normal. No vaginal discharge found.  External GU: no rashes or lesions. Vagina: slightly pale; no discharge. Cervix nl in appearance; no CMT. Bimanual exam: no tenderness or  masses. Rectal exam: no masses; no stool for hemoccult.   Musculoskeletal: She exhibits no edema.  Lymphadenopathy:    She has no cervical adenopathy.  Neurological: She is alert and oriented to person, place, and time.  Skin: Skin is warm and dry. No rash noted.  Psychiatric: She has a normal mood and affect. Her behavior is normal.  Vitals reviewed. Breast exam: slightly dense tissue; no masses; axillae no adenopathy.         Assessment & Plan:   Problem List Items Addressed This Visit      Endocrine   Hypothyroidism   Relevant Orders   TSH    Other Visit Diagnoses    Well woman exam    -  Primary    Relevant Orders    Pap IG and HPV (high risk) DNA detection    POC Hemoccult Bld/Stl (3-Cd Home Screen)    Screening for cervical cancer        Relevant Orders    Pap IG and HPV (high risk) DNA detection    Screening for HPV (human papillomavirus)        Relevant Orders    Pap IG and HPV (high risk) DNA detection      Cancel MRI and referral to Dr. Aline Brochure. Recommend Tdap at local pharmacy. 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  Return in about 1 year (around 02/20/2016) for physical.

## 2015-02-21 LAB — TSH: TSH: 1.32 u[IU]/mL (ref 0.450–4.500)

## 2015-02-22 ENCOUNTER — Other Ambulatory Visit: Payer: Self-pay | Admitting: Nurse Practitioner

## 2015-02-22 ENCOUNTER — Ambulatory Visit (HOSPITAL_COMMUNITY): Payer: BLUE CROSS/BLUE SHIELD

## 2015-02-22 ENCOUNTER — Ambulatory Visit (HOSPITAL_COMMUNITY)
Admission: RE | Admit: 2015-02-22 | Discharge: 2015-02-22 | Disposition: A | Discharge status: Home / Self Care | Payer: BLUE CROSS/BLUE SHIELD | Source: Ambulatory Visit | Attending: Nurse Practitioner | Admitting: Nurse Practitioner

## 2015-02-22 DIAGNOSIS — Z1231 Encounter for screening mammogram for malignant neoplasm of breast: Secondary | ICD-10-CM | POA: Diagnosis present

## 2015-02-22 MED ORDER — LEVOTHYROXINE SODIUM 50 MCG PO TABS: 50.0000 ug | ORAL_TABLET | Freq: Every day | ORAL | Status: DC

## 2015-02-23 LAB — PAP IG AND HPV HIGH-RISK
HPV, high-risk: NEGATIVE
PAP Smear Comment: 0

## 2015-02-28 ENCOUNTER — Ambulatory Visit (HOSPITAL_COMMUNITY): Payer: BLUE CROSS/BLUE SHIELD

## 2015-06-21 ENCOUNTER — Ambulatory Visit (INDEPENDENT_AMBULATORY_CARE_PROVIDER_SITE_OTHER): Payer: BLUE CROSS/BLUE SHIELD | Admitting: Nurse Practitioner

## 2015-06-21 ENCOUNTER — Encounter: Payer: Self-pay | Admitting: Nurse Practitioner

## 2015-06-21 VITALS — BP 138/90 | Temp 98.7°F | Ht 65.5 in | Wt 160.0 lb

## 2015-06-21 DIAGNOSIS — J329 Chronic sinusitis, unspecified: Secondary | ICD-10-CM

## 2015-06-21 MED ORDER — NALTREXONE-BUPROPION HCL ER 8-90 MG PO TB12
ORAL_TABLET | ORAL | Status: DC
Start: 2015-06-21 — End: 2015-06-21

## 2015-06-21 MED ORDER — AMOXICILLIN-POT CLAVULANATE 875-125 MG PO TABS: 1.0000 | ORAL_TABLET | Freq: Two times a day (BID) | ORAL | Status: DC

## 2015-06-21 MED ORDER — NALTREXONE-BUPROPION HCL ER 8-90 MG PO TB12: ORAL_TABLET | ORAL | Status: DC

## 2015-06-22 ENCOUNTER — Encounter: Payer: Self-pay | Admitting: Nurse Practitioner

## 2015-06-22 NOTE — Progress Notes (Signed)
Subjective:  Presents complaints of sinus symptoms over the past 12 days. No relief with OTC meds. Headache and sore throat have improved. No fever. Increased cough especially at night. Postnasal drainage. Yellow mucus at times. Ear pain. No wheezing.  Objective:   BP 138/90 mmHg  Temp(Src) 98.7 F (37.1 C) (Oral)  Ht 5' 5.5" (1.664 m)  Wt 160 lb (72.576 kg)  BMI 26.21 kg/m2 NAD. Alert, oriented. TMs clear effusion, no erythema. Pharynx injected with PND noted. Neck supple with mild soft anterior adenopathy. Lungs clear. Heart regular rate rhythm.  Assessment: Rhinosinusitis  Plan:  Meds ordered this encounter  Medications  . amoxicillin-clavulanate (AUGMENTIN) 875-125 MG tablet    Sig: Take 1 tablet by mouth 2 (two) times daily.    Dispense:  20 tablet    Refill:  0    Order Specific Question:  Supervising Provider    Answer:  Mikey Kirschner [[id number]]                                                     OTC meds as directed for cough and congestion. Call back if worsens or persists.

## 2015-09-30 ENCOUNTER — Other Ambulatory Visit: Payer: Self-pay | Admitting: Family Medicine

## 2015-11-21 ENCOUNTER — Other Ambulatory Visit: Payer: Self-pay | Admitting: *Deleted

## 2015-11-21 ENCOUNTER — Telehealth: Payer: Self-pay | Admitting: *Deleted

## 2015-11-21 MED ORDER — PREDNISONE 20 MG PO TABS: ORAL_TABLET | ORAL | Status: DC

## 2015-11-21 MED ORDER — HYDROCODONE-HOMATROPINE 5-1.5 MG/5ML PO SYRP: ORAL_SOLUTION | ORAL | Status: DC

## 2015-11-21 NOTE — Telephone Encounter (Signed)
pred taper and hycodan ok per dr Richardson Landry. Scripts ready. Pt notified.

## 2015-11-21 NOTE — Telephone Encounter (Signed)
LMRC

## 2015-11-21 NOTE — Telephone Encounter (Signed)
Having cough and congestion since Friday. Dr. Elonda Husky is her neighbor and he called in amoxil for her. She is requesting prednisone and hycodan cough syrup to go with it. Up all night coughing, no fever, no wheezing or sob. [facility].

## 2015-11-27 ENCOUNTER — Other Ambulatory Visit: Payer: Self-pay | Admitting: Family Medicine

## 2016-01-18 ENCOUNTER — Encounter: Payer: Self-pay | Admitting: Family Medicine

## 2016-01-18 ENCOUNTER — Ambulatory Visit (INDEPENDENT_AMBULATORY_CARE_PROVIDER_SITE_OTHER): Payer: BLUE CROSS/BLUE SHIELD | Admitting: Family Medicine

## 2016-01-18 VITALS — BP 110/60 | Temp 98.2°F | Ht 65.5 in | Wt 158.4 lb

## 2016-01-18 DIAGNOSIS — J309 Allergic rhinitis, unspecified: Secondary | ICD-10-CM

## 2016-01-18 DIAGNOSIS — J019 Acute sinusitis, unspecified: Secondary | ICD-10-CM

## 2016-01-18 DIAGNOSIS — B9689 Other specified bacterial agents as the cause of diseases classified elsewhere: Secondary | ICD-10-CM

## 2016-01-18 MED ORDER — AZELASTINE-FLUTICASONE 137-50 MCG/ACT NA SUSP: 1.0000 | Freq: Two times a day (BID) | NASAL | 12 refills | Status: DC

## 2016-01-18 MED ORDER — AMOXICILLIN-POT CLAVULANATE 875-125 MG PO TABS: 1.0000 | ORAL_TABLET | Freq: Two times a day (BID) | ORAL | 0 refills | Status: DC

## 2016-01-18 MED ORDER — HYDROCODONE-HOMATROPINE 5-1.5 MG/5ML PO SYRP: ORAL_SOLUTION | ORAL | 0 refills | Status: DC

## 2016-01-18 NOTE — Progress Notes (Signed)
   Subjective:    Patient ID: Katie Ingram, female    DOB: 1955-09-13, 60 y.o.   MRN: [id number]  Cough  This is a new problem. The current episode started 1 to 4 weeks ago. Associated symptoms include ear pain, headaches, nasal congestion and a sore throat. Treatments tried: mucinex.   Aug 875 bid 10 days in late May Moderate allergy sx  patient relates allergy symptoms head congestion drainage coughing sneezing not feeling good cough keeps her up at night   Review of Systems  HENT: Positive for ear pain and sore throat.   Respiratory: Positive for cough.   Neurological: Positive for headaches.       Objective:   Physical Exam Neck no masses eardrums normal sinus nontender subjective sinus pressure lungs clear no crackles       Assessment & Plan:  Hycodan for cough nighttime use only caution drowsiness  Augmentin for 2 weeks Dymista for allergies Hold off on steroids

## 2016-02-01 ENCOUNTER — Ambulatory Visit (INDEPENDENT_AMBULATORY_CARE_PROVIDER_SITE_OTHER): Payer: BLUE CROSS/BLUE SHIELD | Admitting: Family Medicine

## 2016-02-01 ENCOUNTER — Encounter: Payer: Self-pay | Admitting: Family Medicine

## 2016-02-01 VITALS — BP 110/70 | Temp 97.6°F | Ht 65.5 in | Wt 158.5 lb

## 2016-02-01 DIAGNOSIS — J019 Acute sinusitis, unspecified: Secondary | ICD-10-CM

## 2016-02-01 DIAGNOSIS — J309 Allergic rhinitis, unspecified: Secondary | ICD-10-CM | POA: Diagnosis not present

## 2016-02-01 MED ORDER — METHYLPREDNISOLONE ACETATE 40 MG/ML IJ SUSP
40.0000 mg | Freq: Once | INTRAMUSCULAR | Status: AC
Start: 2016-02-01 — End: 2016-02-01
  Administered 2016-02-01: 40 mg via INTRAMUSCULAR

## 2016-02-01 MED ORDER — LEVOFLOXACIN 500 MG PO TABS: 500.0000 mg | ORAL_TABLET | Freq: Every day | ORAL | 0 refills | Status: DC

## 2016-02-01 NOTE — Progress Notes (Signed)
   Subjective:    Patient ID: Katie Ingram, female    DOB: 1955/11/06, 60 y.o.   MRN: [id number]  Sinusitis  The current episode started 1 to 4 weeks ago. The problem is unchanged. There has been no fever. Associated symptoms include congestion, coughing, ear pain, headaches and a sore throat. Past treatments include antibiotics (allegra, dymista). The treatment provided no relief.   Patient states that she has no other concerns at this time.    Review of Systems  HENT: Positive for congestion, ear pain and sore throat.   Respiratory: Positive for cough.   Neurological: Positive for headaches.       Objective:   Physical Exam  negative sinus tenderness eardrums normal throat normal neck supple lungs clear heart regular       Assessment & Plan:   upper rest real illness probably more allergies than anything else prescription for antibiotic given just in case otherwise Depo-Medrol along with allergy medicine and allergy spray should gradually get better

## 2016-02-12 ENCOUNTER — Other Ambulatory Visit: Payer: Self-pay | Admitting: Family Medicine

## 2016-02-12 ENCOUNTER — Other Ambulatory Visit: Payer: Self-pay | Admitting: Nurse Practitioner

## 2016-03-14 ENCOUNTER — Ambulatory Visit (INDEPENDENT_AMBULATORY_CARE_PROVIDER_SITE_OTHER): Payer: BLUE CROSS/BLUE SHIELD | Admitting: Nurse Practitioner

## 2016-03-14 ENCOUNTER — Encounter: Payer: Self-pay | Admitting: Nurse Practitioner

## 2016-03-14 VITALS — BP 114/74 | Ht 67.0 in | Wt 156.5 lb

## 2016-03-14 DIAGNOSIS — Z1231 Encounter for screening mammogram for malignant neoplasm of breast: Secondary | ICD-10-CM | POA: Diagnosis not present

## 2016-03-14 DIAGNOSIS — E039 Hypothyroidism, unspecified: Secondary | ICD-10-CM | POA: Diagnosis not present

## 2016-03-14 DIAGNOSIS — Z01419 Encounter for gynecological examination (general) (routine) without abnormal findings: Secondary | ICD-10-CM | POA: Diagnosis not present

## 2016-03-15 ENCOUNTER — Encounter: Payer: Self-pay | Admitting: Nurse Practitioner

## 2016-03-15 NOTE — Progress Notes (Signed)
   Subjective:    Patient ID: Katie Ingram, female    DOB: May 15, 1956, 60 y.o.   MRN: [id number]  HPI presents for her wellness physical. Same sexual partner. Has had endometrial patient. No vaginal bleeding or discharge. No pelvic pain. Is unsure about the date of her last colonoscopy. Regular vision and dental exams. Regular exercise. Gets her flu vaccine at work. Has a copy of routine lab work today, does not include a thyroid check.    Review of Systems  Constitutional: Negative for activity change, appetite change and fatigue.  HENT: Negative for dental problem, ear pain, sinus pressure and sore throat.   Respiratory: Negative for cough, chest tightness, shortness of breath and wheezing.   Cardiovascular: Negative for chest pain.  Gastrointestinal: Negative for abdominal distention, abdominal pain, constipation, diarrhea, nausea and vomiting.  Genitourinary: Negative for difficulty urinating, dysuria, enuresis, frequency, genital sores, pelvic pain, urgency, vaginal bleeding and vaginal discharge.       Objective:   Physical Exam  Constitutional: She is oriented to person, place, and time. She appears well-developed. No distress.  HENT:  Right Ear: External ear normal.  Left Ear: External ear normal.  Mouth/Throat: Oropharynx is clear and moist.  Neck: Normal range of motion. Neck supple. No tracheal deviation present. No thyromegaly present.  Cardiovascular: Normal rate, regular rhythm and normal heart sounds.  Exam reveals no gallop.   No murmur heard. Pulmonary/Chest: Effort normal and breath sounds normal.  Abdominal: Soft. She exhibits no distension. There is no tenderness.  Genitourinary: Vagina normal and uterus normal. No vaginal discharge found.  Genitourinary Comments: External GU: no rashes or lesions. Vagina: no discharge. Cervix normal in appearance. Bimanual exam: no tenderness or obvious masses. Rectal exam: no masses; no stool for hemoccult.   Musculoskeletal: She  exhibits no edema.  Lymphadenopathy:    She has no cervical adenopathy.  Neurological: She is alert and oriented to person, place, and time.  Skin: Skin is warm and dry. No rash noted.  Psychiatric: She has a normal mood and affect. Her behavior is normal.  Vitals reviewed. Breast exam: areas of dense tissue; no masses noted; axillae no adenopathy.         Assessment & Plan:   Problem List Items Addressed This Visit      Endocrine   Hypothyroidism   Relevant Orders   TSH    Other Visit Diagnoses    Well woman exam    -  Primary   Relevant Orders   VITAMIN D 25 Hydroxy (Vit-D Deficiency, Fractures)   POC Hemoccult Bld/Stl (3-Cd Home Screen)   Screening mammogram, encounter for       Relevant Orders   MM SCREENING BREAST TOMO BILATERAL     Encouraged continued activity and healthy diet. Flu vaccine at work.  Return in about 1 year (around 03/14/2017) for physical.

## 2016-03-19 ENCOUNTER — Other Ambulatory Visit: Payer: Self-pay | Admitting: Nurse Practitioner

## 2016-03-19 ENCOUNTER — Ambulatory Visit (HOSPITAL_COMMUNITY)
Admission: RE | Admit: 2016-03-19 | Discharge: 2016-03-19 | Disposition: A | Discharge status: Home / Self Care | Payer: BLUE CROSS/BLUE SHIELD | Source: Ambulatory Visit | Attending: Nurse Practitioner | Admitting: Nurse Practitioner

## 2016-03-19 DIAGNOSIS — Z1231 Encounter for screening mammogram for malignant neoplasm of breast: Secondary | ICD-10-CM | POA: Insufficient documentation

## 2016-03-20 ENCOUNTER — Other Ambulatory Visit: Payer: Self-pay

## 2016-03-20 DIAGNOSIS — Z01419 Encounter for gynecological examination (general) (routine) without abnormal findings: Secondary | ICD-10-CM

## 2016-03-20 LAB — VITAMIN D 25 HYDROXY (VIT D DEFICIENCY, FRACTURES): VIT D 25 HYDROXY: 34.2 ng/mL (ref 30.0–100.0)

## 2016-03-20 LAB — POC HEMOCCULT BLD/STL (HOME/3-CARD/SCREEN)
Card #2 Fecal Occult Blod, POC: NEGATIVE
Card #3 Fecal Occult Blood, POC: NEGATIVE
Fecal Occult Blood, POC: NEGATIVE

## 2016-03-20 LAB — TSH: TSH: 2.25 u[IU]/mL (ref 0.450–4.500)

## 2016-03-21 ENCOUNTER — Encounter: Payer: Self-pay | Admitting: Nurse Practitioner

## 2016-04-07 ENCOUNTER — Telehealth: Payer: Self-pay | Admitting: Family Medicine

## 2016-04-07 ENCOUNTER — Other Ambulatory Visit: Payer: Self-pay | Admitting: *Deleted

## 2016-04-07 MED ORDER — AMOXICILLIN-POT CLAVULANATE 875-125 MG PO TABS: 1.0000 | ORAL_TABLET | Freq: Two times a day (BID) | ORAL | 0 refills | Status: DC

## 2016-04-07 MED ORDER — PREDNISONE 20 MG PO TABS: ORAL_TABLET | ORAL | 0 refills | Status: DC

## 2016-04-07 NOTE — Telephone Encounter (Signed)
Left message to return call to discuss. meds already sent to pharm

## 2016-04-07 NOTE — Progress Notes (Unsigned)
a 

## 2016-04-07 NOTE — Telephone Encounter (Signed)
Patient said she was seen in August and given an Rx for Levaquin to keep on hand just in case she had a flare up of her allergies again.  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  She took the Levaquin and had a bad reaction to it over the weekend.  She said her eyes swelled up and were very itchy.  She wants to know if we can change the Rx?  She said she ran a fever all weekend.  Larene Pickett

## 2016-04-07 NOTE — Telephone Encounter (Signed)
Discussed with pt. Pt verbalized understanding. Med sent to pharm.  

## 2016-04-07 NOTE — Telephone Encounter (Signed)
If eyes swelled up and itchy may be possible this was part of the initial allergy rxn and not a rxn to levaquin, we will document possible lev allergy but may not be with the itching anfd swelling occuriing after an exposure. Aug 875 bid ten d, andf adult pred taper, let pt n know normally we require ntbs before calling in mor abx if not seen for awhile but will this time

## 2016-04-16 ENCOUNTER — Telehealth: Payer: Self-pay | Admitting: Family Medicine

## 2016-04-16 NOTE — Telephone Encounter (Signed)
(  Message for Hoyle Sauer) patient was seen 10/6 and would like results of labs, hemoccult card,colonocopy. She states she hasnt heard anything yet.

## 2016-04-24 NOTE — Telephone Encounter (Signed)
Patient called again to get results of labs that were done in Oct.

## 2016-04-29 NOTE — Telephone Encounter (Signed)
Left message on voicemail to return call.

## 2016-04-29 NOTE — Telephone Encounter (Signed)
A letter was sent on 10/13 (I printed the question is did [facility] mail it). All labs were normal. If patient did not receive letter please let Alison Murray know (if we have correct address) Thanks.

## 2016-05-05 NOTE — Telephone Encounter (Signed)
Discussed with pt. Pt received letter about colonoscopy but not about labs. I did not see letter about labs in system but let pt know labs were normal per carolyn

## 2016-05-21 ENCOUNTER — Other Ambulatory Visit: Payer: Self-pay | Admitting: Family Medicine

## 2016-09-19 ENCOUNTER — Other Ambulatory Visit: Payer: Self-pay | Admitting: Family Medicine

## 2016-11-09 ENCOUNTER — Other Ambulatory Visit: Payer: Self-pay | Admitting: Family Medicine

## 2016-11-10 ENCOUNTER — Other Ambulatory Visit: Payer: Self-pay | Admitting: Family Medicine

## 2016-12-10 ENCOUNTER — Other Ambulatory Visit: Payer: Self-pay | Admitting: Family Medicine

## 2016-12-30 ENCOUNTER — Other Ambulatory Visit: Payer: Self-pay | Admitting: Family Medicine

## 2017-01-26 ENCOUNTER — Other Ambulatory Visit: Payer: Self-pay | Admitting: Family Medicine

## 2017-02-25 ENCOUNTER — Other Ambulatory Visit: Payer: Self-pay | Admitting: Family Medicine

## 2017-04-05 ENCOUNTER — Other Ambulatory Visit: Payer: Self-pay | Admitting: Family Medicine

## 2017-05-12 ENCOUNTER — Telehealth: Payer: Self-pay | Admitting: Family Medicine

## 2017-05-12 MED ORDER — LEVOTHYROXINE SODIUM 50 MCG PO TABS: ORAL_TABLET | ORAL | 0 refills | Status: DC

## 2017-05-12 NOTE — Telephone Encounter (Signed)
Patient has an appt for a physical with Hoyle Sauer on 06/04/17 but will run out of her medicine prior to that.  Needs a refill sent to [facility].  SYNTHROID 50 MCG tablet

## 2017-05-12 NOTE — Telephone Encounter (Signed)
Prescription sent electronically to pharmacy. Patient notified. 

## 2017-06-04 ENCOUNTER — Ambulatory Visit (INDEPENDENT_AMBULATORY_CARE_PROVIDER_SITE_OTHER): Payer: PRIVATE HEALTH INSURANCE | Admitting: Nurse Practitioner

## 2017-06-04 VITALS — BP 132/88 | Ht 66.0 in | Wt 159.0 lb

## 2017-06-04 DIAGNOSIS — R011 Cardiac murmur, unspecified: Secondary | ICD-10-CM

## 2017-06-04 DIAGNOSIS — Z0001 Encounter for general adult medical examination with abnormal findings: Secondary | ICD-10-CM | POA: Diagnosis not present

## 2017-06-04 DIAGNOSIS — Z780 Asymptomatic menopausal state: Secondary | ICD-10-CM | POA: Diagnosis not present

## 2017-06-04 DIAGNOSIS — E039 Hypothyroidism, unspecified: Secondary | ICD-10-CM

## 2017-06-04 DIAGNOSIS — R748 Abnormal levels of other serum enzymes: Secondary | ICD-10-CM

## 2017-06-04 DIAGNOSIS — Z0000 Encounter for general adult medical examination without abnormal findings: Secondary | ICD-10-CM

## 2017-06-05 ENCOUNTER — Other Ambulatory Visit: Payer: Self-pay | Admitting: Family Medicine

## 2017-06-05 ENCOUNTER — Encounter: Payer: Self-pay | Admitting: Nurse Practitioner

## 2017-06-05 DIAGNOSIS — R011 Cardiac murmur, unspecified: Secondary | ICD-10-CM | POA: Insufficient documentation

## 2017-06-05 NOTE — Progress Notes (Signed)
Subjective:    Patient ID: Katie Ingram, female    DOB: January 26, 1956, 61 y.o.   MRN: [id number]  HPI presents for her wellness exam.  Same sexual partner.  No vaginal bleeding or pelvic pain.  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  Regular vision and dental exams.  Very active job.  Healthy diet.  Trying to maintain her weight.  Is due for lab work including her TSH.    Review of Systems  Constitutional: Negative for activity change, appetite change and fatigue.  HENT: Negative for dental problem, ear pain, sinus pressure and sore throat.   Respiratory: Negative for cough, chest tightness, shortness of breath and wheezing.   Cardiovascular: Negative for chest pain.  Gastrointestinal: Negative for abdominal distention, abdominal pain, blood in stool, constipation, diarrhea, nausea and vomiting.  Genitourinary: Negative for difficulty urinating, dysuria, enuresis, frequency, genital sores, menstrual problem, pelvic pain, urgency and vaginal discharge.   Depression screen [facility] 2/9 06/04/2017  Decreased Interest 0  Down, Depressed, Hopeless 0  PHQ - 2 Score 0  Altered sleeping 1  Tired, decreased energy 1  Change in appetite 1  Feeling bad or failure about yourself  0  Trouble concentrating 0  Moving slowly or fidgety/restless 0  Suicidal thoughts 0  PHQ-9 Score 3        Objective:   Physical Exam  Constitutional: She is oriented to person, place, and time. She appears well-developed. No distress.  HENT:  Right Ear: External ear normal.  Left Ear: External ear normal.  Mouth/Throat: Oropharynx is clear and moist.  Neck: Normal range of motion. Neck supple. No tracheal deviation present. No thyromegaly present.  Thyroid nontender to palpation, no mass or goiter noted.  Cardiovascular: Normal rate and regular rhythm. Exam reveals no gallop.  Murmur heard. Grade 2/6 soft systolic murmur noted loudest over second ICS- RSB.   Does not radiate into carotids.  Carotids no bruits or thrills.  Pulmonary/Chest: Effort normal and breath sounds normal. Right breast exhibits no inverted nipple, no mass, no skin change and no tenderness. Left breast exhibits no inverted nipple, no mass, no skin change and no tenderness. Breasts are symmetrical.  Axillae no adenopathy.  Abdominal: Soft. She exhibits no distension. There is no tenderness.  Genitourinary: Vagina normal and uterus normal. No vaginal discharge found.  Genitourinary Comments: External GU no rashes or lesions.  Vagina no discharge.  Bimanual exam no tenderness or obvious masses.  Musculoskeletal: She exhibits no edema.  Lymphadenopathy:    She has no cervical adenopathy.  Neurological: She is alert and oriented to person, place, and time.  Skin: Skin is warm and dry. No rash noted.  Psychiatric: She has a normal mood and affect. Her behavior is normal.  Vitals reviewed.         Assessment & Plan:   Problem List Items Addressed This Visit      Endocrine   Hypothyroidism   Relevant Orders   TSH     Other   Systolic murmur    Other Visit Diagnoses    Routine general medical examination at a health care facility    -  Primary   Relevant Orders   Hepatic function panel   Basic metabolic panel   TSH     Encouraged continued activity.  Patient is asymptomatic as far as her murmur at this point.  Call back if any problems.  Otherwise evaluate again at her next physical.  Lab work pending.  After visit it was noted patient has not had a DEXA scan, we will have our nurse contact her regarding this.  Defers flu vaccine today.  Elects to hold on mammogram until next year. Return in about 1 year (around 06/04/2018) for physical.

## 2017-06-06 LAB — BASIC METABOLIC PANEL
BUN/Creatinine Ratio: 24 (ref 12–28)
BUN: 16 mg/dL (ref 8–27)
CO2: 26 mmol/L (ref 20–29)
CREATININE: 0.67 mg/dL (ref 0.57–1.00)
Calcium: 9.2 mg/dL (ref 8.7–10.3)
Chloride: 103 mmol/L (ref 96–106)
GFR, EST AFRICAN AMERICAN: 110 mL/min/{1.73_m2} (ref >59)
GFR, EST NON AFRICAN AMERICAN: 96 mL/min/{1.73_m2} (ref >59)
Glucose: 113 mg/dL — ABNORMAL HIGH (ref 65–99)
Potassium: 4.2 mmol/L (ref 3.5–5.2)
SODIUM: 144 mmol/L (ref 134–144)

## 2017-06-06 LAB — HEPATIC FUNCTION PANEL
ALBUMIN: 4.6 g/dL (ref 3.6–4.8)
ALK PHOS: 59 IU/L (ref 39–117)
ALT: 77 IU/L — ABNORMAL HIGH (ref 0–32)
AST: 38 IU/L (ref 0–40)
BILIRUBIN TOTAL: 0.4 mg/dL (ref 0.0–1.2)
Bilirubin, Direct: 0.1 mg/dL (ref 0.00–0.40)
TOTAL PROTEIN: 6.7 g/dL (ref 6.0–8.5)

## 2017-06-06 LAB — TSH: TSH: 1.56 u[IU]/mL (ref 0.450–4.500)

## 2017-06-08 ENCOUNTER — Other Ambulatory Visit: Payer: Self-pay | Admitting: Nurse Practitioner

## 2017-06-08 ENCOUNTER — Telehealth: Payer: Self-pay | Admitting: *Deleted

## 2017-06-08 DIAGNOSIS — Z1231 Encounter for screening mammogram for malignant neoplasm of breast: Secondary | ICD-10-CM

## 2017-06-08 NOTE — Telephone Encounter (Signed)
Discussed with pt. Pt verbalized understanding.  °

## 2017-06-08 NOTE — Telephone Encounter (Signed)
Left message to return call. Katie Ingram wants pt to have a bone density. I called pt and she wanted to schedule her own appt. I put in order and called radiology at [facility] and they state she cannot schedule her own but she can reschedule. I scheduled for jan 10th register 1:15. Pt may reschedule if she wants to. Pt needs to bring a list of meds and no calcium 48 hours prior to test.

## 2017-06-08 NOTE — Addendum Note (Signed)
Addended by: Carmelina Noun on: 06/08/2017 09:08 AM   Modules accepted: Orders

## 2017-06-08 NOTE — Addendum Note (Signed)
Addended by: Karle Barr on: 06/08/2017 09:36 AM   Modules accepted: Orders

## 2017-06-15 ENCOUNTER — Other Ambulatory Visit: Payer: Self-pay | Admitting: Nurse Practitioner

## 2017-06-17 ENCOUNTER — Other Ambulatory Visit (HOSPITAL_COMMUNITY): Payer: BLUE CROSS/BLUE SHIELD

## 2017-06-17 ENCOUNTER — Ambulatory Visit (HOSPITAL_COMMUNITY)
Admission: RE | Admit: 2017-06-17 | Discharge: 2017-06-17 | Disposition: A | Discharge status: Home / Self Care | Payer: PRIVATE HEALTH INSURANCE | Source: Ambulatory Visit | Attending: Nurse Practitioner | Admitting: Nurse Practitioner

## 2017-06-17 ENCOUNTER — Encounter (HOSPITAL_COMMUNITY): Payer: Self-pay

## 2017-06-17 DIAGNOSIS — Z1231 Encounter for screening mammogram for malignant neoplasm of breast: Secondary | ICD-10-CM | POA: Diagnosis present

## 2017-06-17 DIAGNOSIS — Z780 Asymptomatic menopausal state: Secondary | ICD-10-CM | POA: Diagnosis not present

## 2017-06-17 DIAGNOSIS — Z13820 Encounter for screening for osteoporosis: Secondary | ICD-10-CM | POA: Diagnosis present

## 2017-06-18 ENCOUNTER — Other Ambulatory Visit (HOSPITAL_COMMUNITY): Payer: BLUE CROSS/BLUE SHIELD

## 2017-08-08 LAB — HEPATIC FUNCTION PANEL
ALK PHOS: 66 IU/L (ref 39–117)
ALT: 50 IU/L — AB (ref 0–32)
AST: 23 IU/L (ref 0–40)
Albumin: 4.6 g/dL (ref 3.6–4.8)
Bilirubin Total: 0.3 mg/dL (ref 0.0–1.2)
Bilirubin, Direct: 0.11 mg/dL (ref 0.00–0.40)
Total Protein: 6.6 g/dL (ref 6.0–8.5)

## 2017-09-07 ENCOUNTER — Other Ambulatory Visit: Payer: Self-pay | Admitting: Family Medicine

## 2017-09-15 IMAGING — MG MM SCREENING BREAST TOMO BILATERAL
9 series · 9 of 25 positions shown · non-contrast
Comparison: Previous exam(s).

CLINICAL DATA: Screening.

EXAM:
DIGITAL SCREENING BILATERAL MAMMOGRAM WITH 3D TOMO WITH CAD

[R CC (1 of 2)]
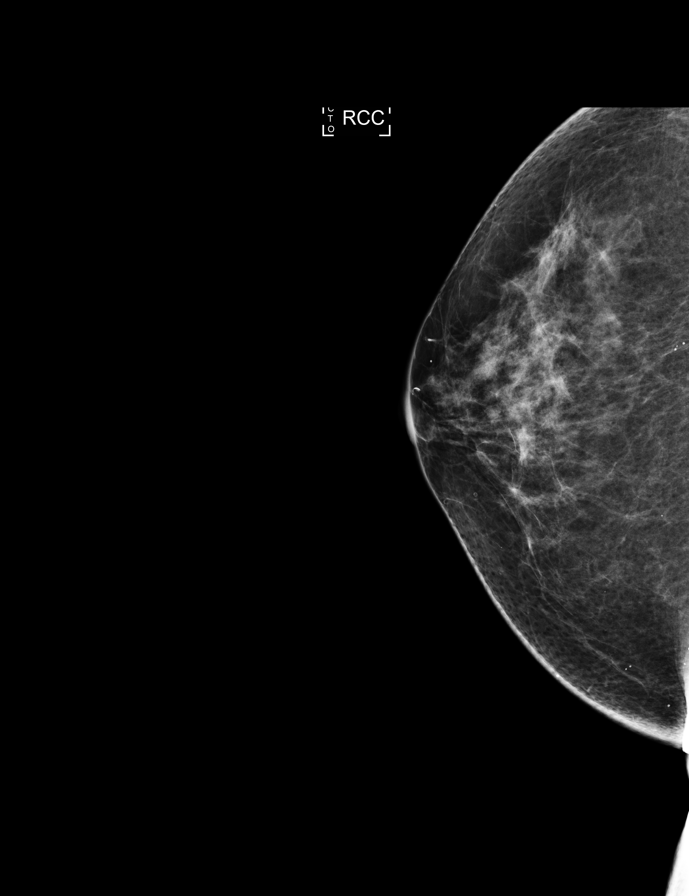

[L CC]
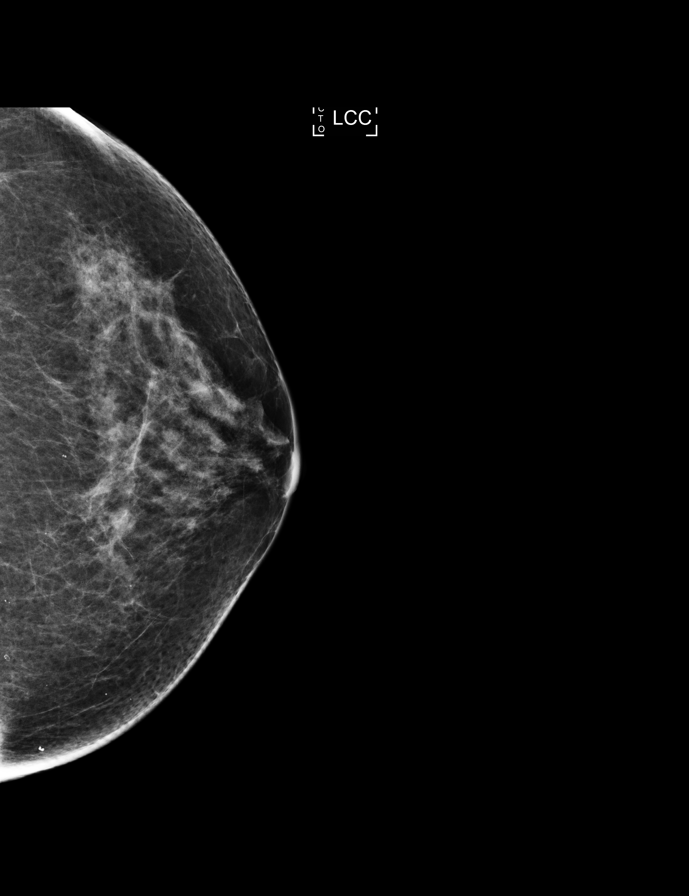

[L MLO]
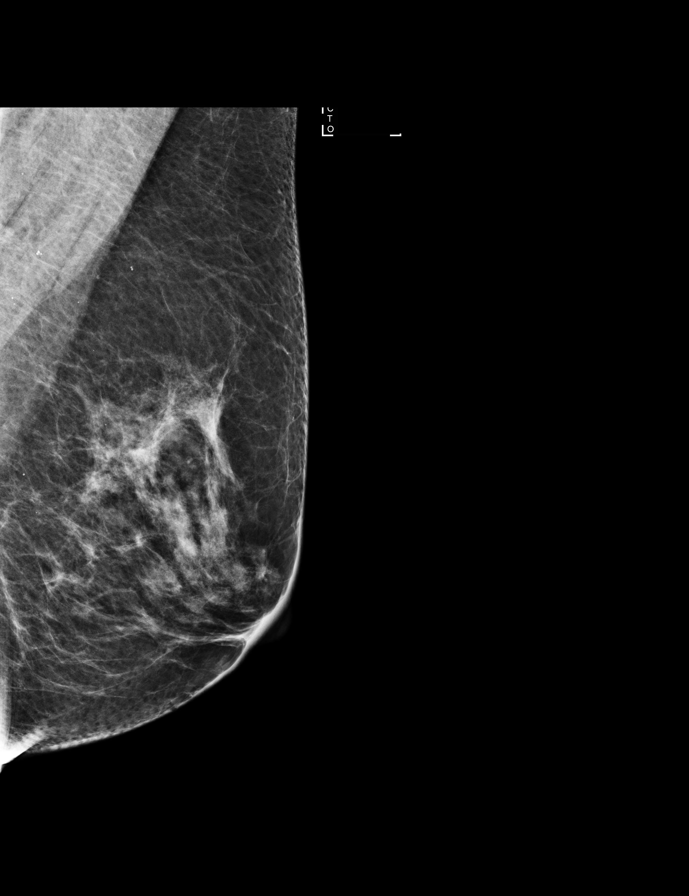

[R CC (2 of 2)]
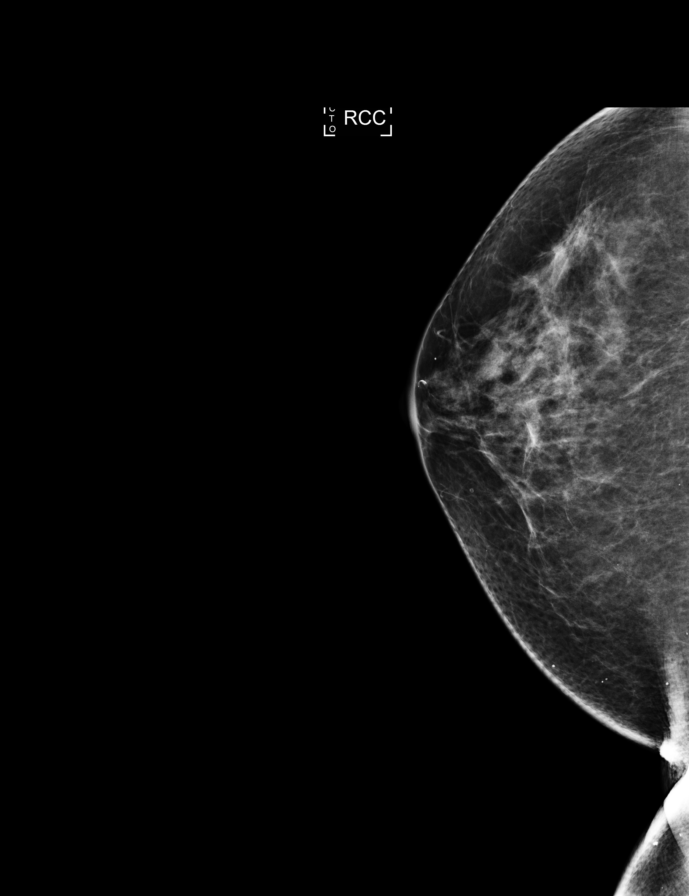

[R MLO]
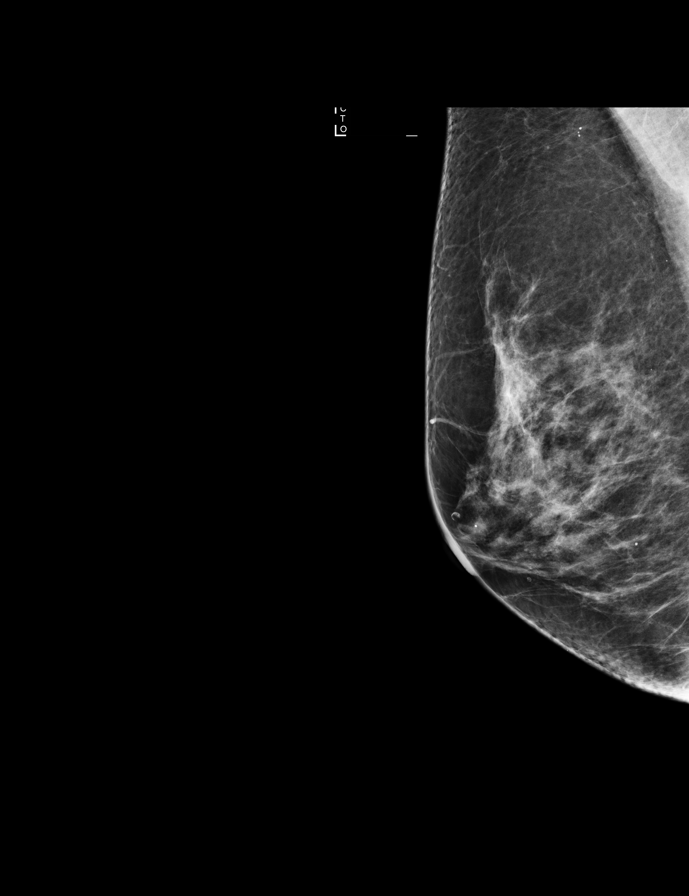

[L CC tomo · tomo slice 39/76.0]
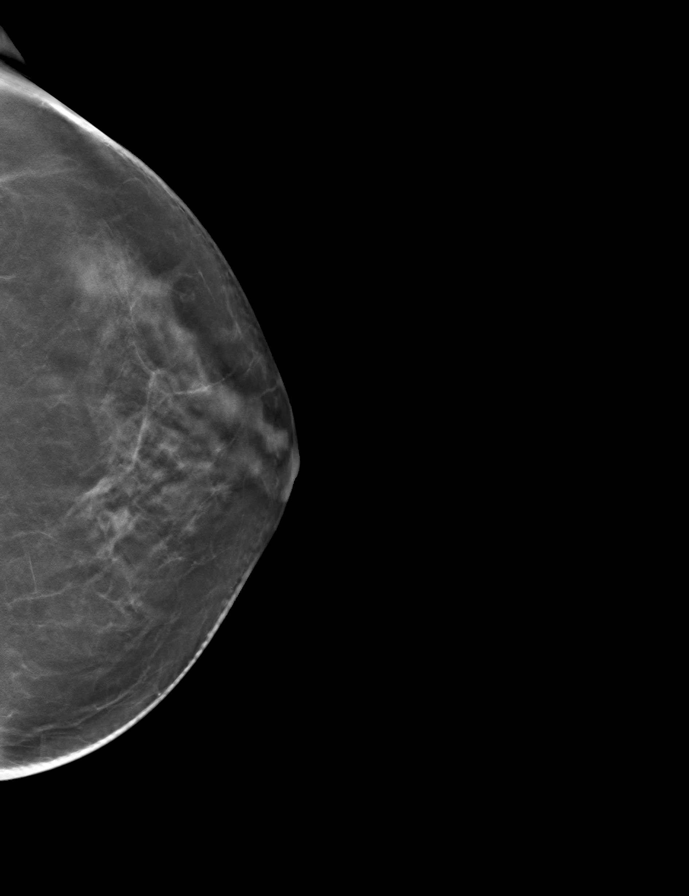

[R CC tomo · tomo slice 39/76.0]
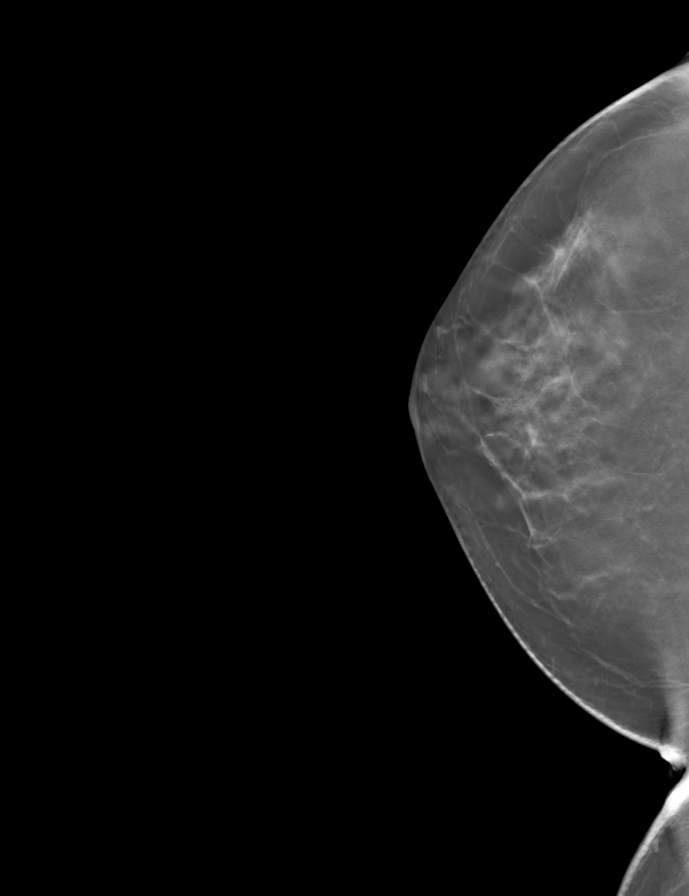

[L MLO tomo · tomo slice 37/73.0]
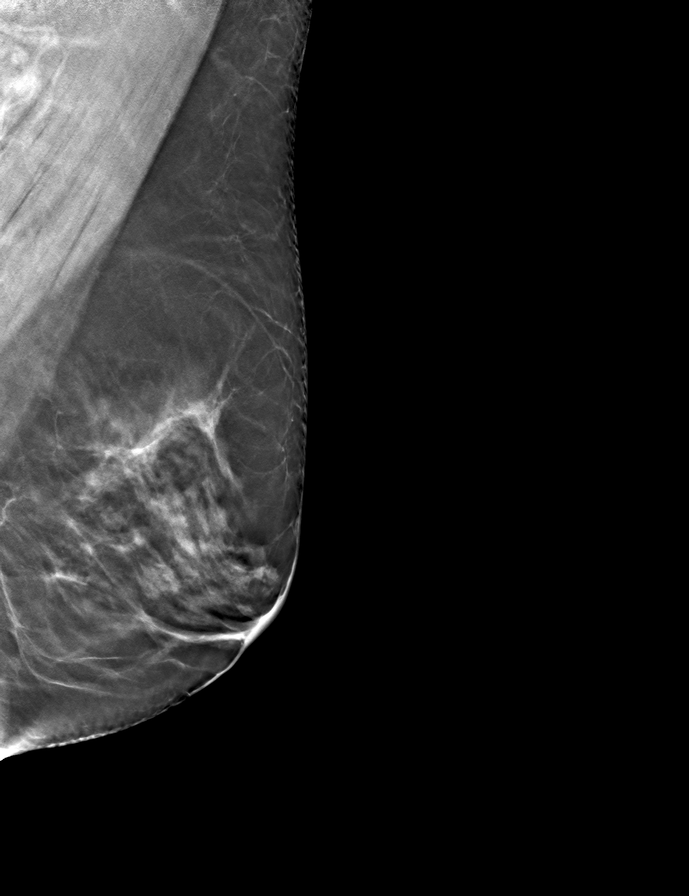

[R MLO tomo · tomo slice 35/69.0]
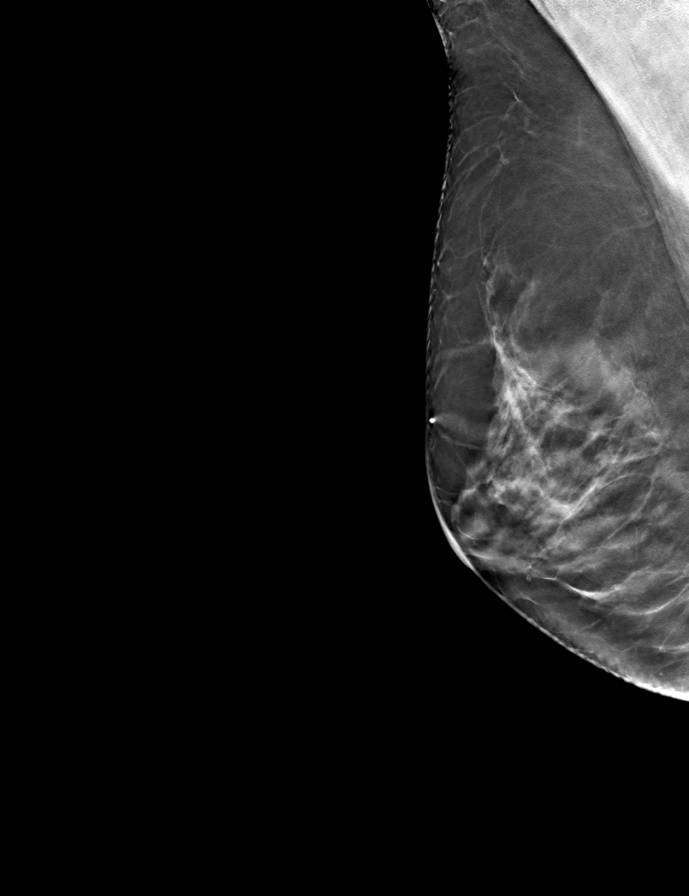

[9 of 25 positions shown; findings below may reference images not displayed]

ACR Breast Density Category b: There are scattered areas of
fibroglandular density.
FINDINGS: There are no findings suspicious for malignancy. Images were
processed with CAD.
IMPRESSION: No mammographic evidence of malignancy. A result letter of this
screening mammogram will be mailed directly to the patient.

RECOMMENDATION:
Screening mammogram in one year. (Code:55-L-23V)

BI-RADS CATEGORY  1: Negative.

## 2017-10-01 ENCOUNTER — Encounter: Payer: Self-pay | Admitting: Nurse Practitioner

## 2018-03-03 ENCOUNTER — Other Ambulatory Visit: Payer: Self-pay | Admitting: Family Medicine

## 2018-06-16 ENCOUNTER — Encounter: Payer: Self-pay | Admitting: Family Medicine

## 2018-06-17 ENCOUNTER — Other Ambulatory Visit: Payer: Self-pay

## 2018-06-17 MED ORDER — LEVOTHYROXINE SODIUM 50 MCG PO TABS: ORAL_TABLET | ORAL | 0 refills | Status: DC

## 2018-06-17 MED ORDER — MONTELUKAST SODIUM 10 MG PO TABS: ORAL_TABLET | ORAL | 0 refills | Status: DC

## 2018-06-18 ENCOUNTER — Other Ambulatory Visit: Payer: Self-pay

## 2018-06-18 MED ORDER — LEVOTHYROXINE SODIUM 50 MCG PO TABS: ORAL_TABLET | ORAL | 0 refills | Status: DC

## 2018-06-18 MED ORDER — MONTELUKAST SODIUM 10 MG PO TABS: ORAL_TABLET | ORAL | 0 refills | Status: DC

## 2018-07-07 ENCOUNTER — Telehealth: Payer: Self-pay | Admitting: Family Medicine

## 2018-07-07 DIAGNOSIS — E559 Vitamin D deficiency, unspecified: Secondary | ICD-10-CM

## 2018-07-07 DIAGNOSIS — Z79899 Other long term (current) drug therapy: Secondary | ICD-10-CM

## 2018-07-07 DIAGNOSIS — Z13220 Encounter for screening for lipoid disorders: Secondary | ICD-10-CM

## 2018-07-07 DIAGNOSIS — E039 Hypothyroidism, unspecified: Secondary | ICD-10-CM

## 2018-07-07 DIAGNOSIS — R748 Abnormal levels of other serum enzymes: Secondary | ICD-10-CM

## 2018-07-07 NOTE — Telephone Encounter (Signed)
Pt has cpe scheduled for 07/23/2018. She would like to have her lab work done before appt.

## 2018-07-07 NOTE — Telephone Encounter (Signed)
Same plus lipid and vit d

## 2018-07-07 NOTE — Telephone Encounter (Signed)
Last labs were 05/26/2017 Hep,met 7,tsh. Please advise.

## 2018-07-08 NOTE — Telephone Encounter (Signed)
Patient is aware 

## 2018-07-17 LAB — HEPATIC FUNCTION PANEL
ALBUMIN: 4.7 g/dL (ref 3.8–4.8)
ALT: 31 IU/L (ref 0–32)
AST: 19 IU/L (ref 0–40)
Alkaline Phosphatase: 47 IU/L (ref 39–117)
Bilirubin Total: 0.4 mg/dL (ref 0.0–1.2)
Bilirubin, Direct: 0.1 mg/dL (ref 0.00–0.40)
Total Protein: 6.8 g/dL (ref 6.0–8.5)

## 2018-07-17 LAB — LIPID PANEL
CHOL/HDL RATIO: 3.3 ratio (ref 0.0–4.4)
Cholesterol, Total: 196 mg/dL (ref 100–199)
HDL: 59 mg/dL (ref >39)
LDL Calculated: 119 mg/dL — ABNORMAL HIGH (ref 0–99)
Triglycerides: 90 mg/dL (ref 0–149)
VLDL Cholesterol Cal: 18 mg/dL (ref 5–40)

## 2018-07-17 LAB — BASIC METABOLIC PANEL
BUN/Creatinine Ratio: 19 (ref 12–28)
BUN: 13 mg/dL (ref 8–27)
CO2: 24 mmol/L (ref 20–29)
Calcium: 9.1 mg/dL (ref 8.7–10.3)
Chloride: 102 mmol/L (ref 96–106)
Creatinine, Ser: 0.7 mg/dL (ref 0.57–1.00)
GFR calc Af Amer: 108 mL/min/{1.73_m2} (ref >59)
GFR calc non Af Amer: 94 mL/min/{1.73_m2} (ref >59)
Glucose: 109 mg/dL — ABNORMAL HIGH (ref 65–99)
POTASSIUM: 4.5 mmol/L (ref 3.5–5.2)
Sodium: 141 mmol/L (ref 134–144)

## 2018-07-17 LAB — VITAMIN D 25 HYDROXY (VIT D DEFICIENCY, FRACTURES): Vit D, 25-Hydroxy: 59.1 ng/mL (ref 30.0–100.0)

## 2018-07-17 LAB — TSH: TSH: 1.51 u[IU]/mL (ref 0.450–4.500)

## 2018-07-23 ENCOUNTER — Ambulatory Visit (INDEPENDENT_AMBULATORY_CARE_PROVIDER_SITE_OTHER): Payer: PRIVATE HEALTH INSURANCE | Admitting: Family Medicine

## 2018-07-23 VITALS — BP 122/80 | Ht 66.0 in | Wt 157.0 lb

## 2018-07-23 DIAGNOSIS — Z0000 Encounter for general adult medical examination without abnormal findings: Secondary | ICD-10-CM

## 2018-07-23 DIAGNOSIS — E039 Hypothyroidism, unspecified: Secondary | ICD-10-CM | POA: Diagnosis not present

## 2018-07-23 DIAGNOSIS — Z23 Encounter for immunization: Secondary | ICD-10-CM | POA: Diagnosis not present

## 2018-07-23 DIAGNOSIS — R7301 Impaired fasting glucose: Secondary | ICD-10-CM

## 2018-07-23 LAB — POCT GLYCOSYLATED HEMOGLOBIN (HGB A1C): Hemoglobin A1C: 4.5 % (ref 4.0–5.6)

## 2018-07-23 MED ORDER — LEVOTHYROXINE SODIUM 50 MCG PO TABS: ORAL_TABLET | ORAL | 1 refills | Status: DC

## 2018-07-23 NOTE — Patient Instructions (Signed)
Preventive Care 40-64 Years, Female Preventive care refers to lifestyle choices and visits with your health care provider that can promote health and wellness. What does preventive care include?   A yearly physical exam. This is also called an annual well check.  Dental exams once or twice a year.  Routine eye exams. Ask your health care provider how often you should have your eyes checked.  Personal lifestyle choices, including: ? Daily care of your teeth and gums. ? Regular physical activity. ? Eating a healthy diet. ? Avoiding tobacco and drug use. ? Limiting alcohol use. ? Practicing safe sex. ? Taking low-dose aspirin daily starting at age 50. ? Taking vitamin and mineral supplements as recommended by your health care provider. What happens during an annual well check? The services and screenings done by your health care provider during your annual well check will depend on your age, overall health, lifestyle risk factors, and family history of disease. Counseling Your health care provider may ask you questions about your:  Alcohol use.  Tobacco use.  Drug use.  Emotional well-being.  Home and relationship well-being.  Sexual activity.  Eating habits.  Work and work environment.  Method of birth control.  Menstrual cycle.  Pregnancy history. Screening You may have the following tests or measurements:  Height, weight, and BMI.  Blood pressure.  Lipid and cholesterol levels. These may be checked every 5 years, or more frequently if you are over 50 years old.  Skin check.  Lung cancer screening. You may have this screening every year starting at age 55 if you have a 30-pack-year history of smoking and currently smoke or have quit within the past 15 years.  Colorectal cancer screening. All adults should have this screening starting at age 50 and continuing until age 75. Your health care provider may recommend screening at age 45. You will have tests every  1-10 years, depending on your results and the type of screening test. People at increased risk should start screening at an earlier age. Screening tests may include: ? Guaiac-based fecal occult blood testing. ? Fecal immunochemical test (FIT). ? Stool DNA test. ? Virtual colonoscopy. ? Sigmoidoscopy. During this test, a flexible tube with a tiny camera (sigmoidoscope) is used to examine your rectum and lower colon. The sigmoidoscope is inserted through your anus into your rectum and lower colon. ? Colonoscopy. During this test, a long, thin, flexible tube with a tiny camera (colonoscope) is used to examine your entire colon and rectum.  Hepatitis C blood test.  Hepatitis B blood test.  Sexually transmitted disease (STD) testing.  Diabetes screening. This is done by checking your blood sugar (glucose) after you have not eaten for a while (fasting). You may have this done every 1-3 years.  Mammogram. This may be done every 1-2 years. Talk to your health care provider about when you should start having regular mammograms. This may depend on whether you have a family history of breast cancer.  BRCA-related cancer screening. This may be done if you have a family history of breast, ovarian, tubal, or peritoneal cancers.  Pelvic exam and Pap test. This may be done every 3 years starting at age 21. Starting at age 30, this may be done every 5 years if you have a Pap test in combination with an HPV test.  Bone density scan. This is done to screen for osteoporosis. You may have this scan if you are at high risk for osteoporosis. Discuss your test results, treatment options,   and if necessary, the need for more tests with your health care provider. Vaccines Your health care provider may recommend certain vaccines, such as:  Influenza vaccine. This is recommended every year.  Tetanus, diphtheria, and acellular pertussis (Tdap, Td) vaccine. You may need a Td booster every 10 years.  Varicella  vaccine. You may need this if you have not been vaccinated.  Zoster vaccine. You may need this after age 38.  Measles, mumps, and rubella (MMR) vaccine. You may need at least one dose of MMR if you were born in 1957 or later. You may also need a second dose.  Pneumococcal 13-valent conjugate (PCV13) vaccine. You may need this if you have certain conditions and were not previously vaccinated.  Pneumococcal polysaccharide (PPSV23) vaccine. You may need one or two doses if you smoke cigarettes or if you have certain conditions.  Meningococcal vaccine. You may need this if you have certain conditions.  Hepatitis A vaccine. You may need this if you have certain conditions or if you travel or work in places where you may be exposed to hepatitis A.  Hepatitis B vaccine. You may need this if you have certain conditions or if you travel or work in places where you may be exposed to hepatitis B.  Haemophilus influenzae type b (Hib) vaccine. You may need this if you have certain conditions. Talk to your health care provider about which screenings and vaccines you need and how often you need them. This information is not intended to replace advice given to you by your health care provider. Make sure you discuss any questions you have with your health care provider. Document Released: 06/22/2015 Document Revised: 07/16/2017 Document Reviewed: 03/27/2015 Elsevier Interactive Patient Education  2019 [employer].

## 2018-07-23 NOTE — Progress Notes (Signed)
Subjective:    Patient ID: Katie Ingram, female    DOB: 16-Oct-1955, 63 y.o.   MRN: [id number]  HPI  The patient comes in today for a wellness visit.  A review of their health history was completed. A review of medications was also completed.  Any needed refills; no  Eating habits: Good  Falls/  MVA accidents in past few months: No  Regular exercise: daily walk/run  Specialist pt sees on regular basis: no  Preventative health issues were discussed. Regular vision and dental. Will schedule mammogram herself. Due for colonoscopy (last done in 2009 per patient) - pt will let us know what her preference is for GI provider before we make referral.   Additional concerns: None  Hypothyroidism: doing well on current dose of levothyroxine. No adverse effects. No S/S of hypo/hyperthyroidism.   Review of Systems  Constitutional: Negative for fever and unexpected weight change.  HENT: Negative for congestion, ear pain and sore throat.   Eyes: Negative for discharge.  Respiratory: Negative for cough and shortness of breath.   Cardiovascular: Negative for chest pain, palpitations and leg swelling.  Gastrointestinal: Negative for abdominal pain and blood in stool.  Genitourinary: Negative for difficulty urinating, hematuria, pelvic pain, vaginal bleeding and vaginal discharge.  Neurological: Negative for headaches.  Psychiatric/Behavioral: Negative for dysphoric mood and suicidal ideas.       Objective:   Physical Exam Vitals signs and nursing note reviewed. Exam conducted with a chaperone present.  Constitutional:      General: She is not in acute distress.    Appearance: Normal appearance. She is well-developed.  HENT:     Head: Normocephalic and atraumatic.     Right Ear: Tympanic membrane normal.     Left Ear: Tympanic membrane normal.     Nose: Nose normal.     Mouth/Throat:     Mouth: Mucous membranes are moist.     Pharynx: Oropharynx is clear. Uvula midline.  Eyes:       General:        Right eye: No discharge.        Left eye: No discharge.     Conjunctiva/sclera: Conjunctivae normal.     Pupils: Pupils are equal, round, and reactive to light.  Neck:     Musculoskeletal: Neck supple.     Thyroid: No thyromegaly.  Cardiovascular:     Rate and Rhythm: Normal rate and regular rhythm.     Heart sounds: Normal heart sounds. No murmur.  Pulmonary:     Effort: Pulmonary effort is normal. No respiratory distress.     Breath sounds: Normal breath sounds. No wheezing.  Chest:     Breasts: Breasts are symmetrical.        Right: Normal.        Left: Normal.  Abdominal:     General: Bowel sounds are normal. There is no distension.     Palpations: Abdomen is soft. There is no mass.     Tenderness: There is no abdominal tenderness.  Genitourinary:    General: Normal vulva.     Vagina: Normal.     Cervix: Normal.     Uterus: Normal.      Adnexa: Right adnexa normal and left adnexa normal.  Musculoskeletal:        General: No deformity.  Lymphadenopathy:     Cervical: No cervical adenopathy.     Upper Body:     Right upper body: No supraclavicular or axillary adenopathy.  Left upper body: No supraclavicular or axillary adenopathy.  Skin:    General: Skin is warm and dry.  Neurological:     Mental Status: She is alert and oriented to person, place, and time.     Coordination: Coordination normal.  Psychiatric:        Mood and Affect: Mood normal.    Results for orders placed or performed in visit on 07/23/18  POCT glycosylated hemoglobin (Hb A1C)  Result Value Ref Range   Hemoglobin A1C 4.5 4.0 - 5.6 %   HbA1c POC (<> result, manual entry)     HbA1c, POC (prediabetic range)     HbA1c, POC (controlled diabetic range)            Assessment & Plan:  1. Encounter for wellness examination in adult Adult wellness-complete.wellness physical was conducted today. Importance of diet and exercise were discussed in detail.  In addition to this  a discussion regarding safety was also covered. We also reviewed over immunizations and gave recommendations regarding current immunization needed for age.   -Tdap done today  -Declines flu In addition to this additional areas were also touched on including: Preventative health exams needed:  Colonoscopy: pt is due, states she will decide on a provider and let us know so we can submit the referral for her. Mammogram: pt is due, states she will schedule herself Pap Smear: UTD, normal with negative HPV, due next year  Patient was advised yearly wellness exam. Recent lab work reviewed with patient.   2. Elevated fasting glucose - Plan: POCT glycosylated hemoglobin (Hb A1C) Last two years have had elevated fasting glucose on lab work. Will check A1c today. A1c normal today - impaired fasting glucose. Encouraged continued healthy diet and exercise.   3. Hypothyroidism, unspecified type TSH stable, doing well on current dose of medication. Will refill.   4. Need for vaccination - Plan: Tdap vaccine greater than or equal to 7yo IM

## 2018-07-26 ENCOUNTER — Other Ambulatory Visit (HOSPITAL_COMMUNITY): Payer: Self-pay | Admitting: Family Medicine

## 2018-07-26 ENCOUNTER — Telehealth: Payer: Self-pay | Admitting: Family Medicine

## 2018-07-26 DIAGNOSIS — Z1231 Encounter for screening mammogram for malignant neoplasm of breast: Secondary | ICD-10-CM

## 2018-07-26 DIAGNOSIS — Z1211 Encounter for screening for malignant neoplasm of colon: Secondary | ICD-10-CM

## 2018-07-26 NOTE — Telephone Encounter (Signed)
Patient states at wellness Friday with Ria Comment there was a discussion in regards of a coloscopy, patient would like to get that scheduled with Dr.Roark in  [place].

## 2018-07-26 NOTE — Telephone Encounter (Signed)
Please place referral for screening colonoscopy with Dr. Gala Romney.   Thank you

## 2018-07-26 NOTE — Telephone Encounter (Signed)
Referral ordered in Epic. 

## 2018-07-30 ENCOUNTER — Ambulatory Visit (HOSPITAL_COMMUNITY)
Admission: RE | Admit: 2018-07-30 | Discharge: 2018-07-30 | Disposition: A | Discharge status: Home / Self Care | Payer: PRIVATE HEALTH INSURANCE | Source: Ambulatory Visit | Attending: Family Medicine | Admitting: Family Medicine

## 2018-07-30 ENCOUNTER — Encounter: Payer: Self-pay | Admitting: Family Medicine

## 2018-07-30 DIAGNOSIS — Z1231 Encounter for screening mammogram for malignant neoplasm of breast: Secondary | ICD-10-CM | POA: Diagnosis not present

## 2018-08-18 ENCOUNTER — Other Ambulatory Visit: Payer: Self-pay

## 2018-08-18 ENCOUNTER — Ambulatory Visit (INDEPENDENT_AMBULATORY_CARE_PROVIDER_SITE_OTHER): Payer: PRIVATE HEALTH INSURANCE | Admitting: *Deleted

## 2018-08-18 DIAGNOSIS — Z1211 Encounter for screening for malignant neoplasm of colon: Secondary | ICD-10-CM

## 2018-08-18 MED ORDER — NA SULFATE-K SULFATE-MG SULF 17.5-3.13-1.6 GM/177ML PO SOLN: 1.0000 | Freq: Once | ORAL | 0 refills | Status: AC

## 2018-08-18 NOTE — Progress Notes (Addendum)
Gastroenterology Pre-Procedure Review  Request Date: 08/18/2018 Requesting Physician: Cheyenne Adas Last TCS 2010 done by Dr. Westley Chandler at [place], [place] family hx of colon cancer.  PATIENT REVIEW QUESTIONS: The patient responded to the following health history questions as indicated:    1. Diabetes Melitis: no 2. Joint replacements in the past 12 months: no 3. Major health problems in the past 3 months: no 4. Has an artificial valve or MVP: no 5. Has a defibrillator: no 6. Has been advised in past to take antibiotics in advance of a procedure like teeth cleaning: no 7. Family history of colon cancer: yes (Maternal grandmother age 53, Maternal grandfather 40's)  47. Alcohol Use: yes (1 glass of wine a week) 9. History of sleep apnea: no  10. History of coronary artery or other vascular stents placed within the last 12 months: no 11. History of any prior anesthesia complications: no    MEDICATIONS & ALLERGIES:    Patient reports the following regarding taking any blood thinners:   Plavix? No Aspirin? no Coumadin? no Brilinta? no Xarelto? no Eliquis? no Pradaxa? no Savaysa? no Effient? no  Patient confirms/reports the following medications:  Current Outpatient Medications  Medication Sig Dispense Refill  . Bacillus Coagulans-Inulin (ALIGN PREBIOTIC-PROBIOTIC PO) Take by mouth. One daily    . cetirizine (ZYRTEC) 10 MG tablet Take 10 mg by mouth daily.    . fish oil-omega-3 fatty acids 1000 MG capsule Take 2 g by mouth daily.    . fluticasone (FLONASE) 50 MCG/ACT nasal spray USE 2 SPRAYS IN EACH       NOSTRIL DAILY AS NEEDED 48 g 3  . Glucosamine-Chondroitin (MOVE FREE PO) Take 1 tablet by mouth daily.    Marland Kitchen levothyroxine (SYNTHROID, LEVOTHROID) 50 MCG tablet TAKE (1) TABLET BY MOUTH ONCE DAILY. 90 tablet 1  . montelukast (SINGULAIR) 10 MG tablet TAKE (1) TABLET BY MOUTH ONCE DAILY. 90 tablet 0  . Multiple Vitamin (MULTIVITAMIN) tablet Take 1 tablet by mouth daily.    . psyllium  (METAMUCIL) 58.6 % powder Take 1 packet by mouth daily.     No current facility-administered medications for this visit.     Patient confirms/reports the following allergies:  Allergies  Allergen Reactions  . Bactrim [Sulfamethoxazole-Trimethoprim] Itching  . Biaxin [Clarithromycin]     'cannot swallow"  . Ceftin [Cefuroxime Axetil]   . Compazine [Prochlorperazine Edisylate]   . Floxacillin [Flucloxacillin]   . Levaquin [Levofloxacin In D5w]     Swelling and itching of the eyes  . Tobramycin     swelling    No orders of the defined types were placed in this encounter.   AUTHORIZATION INFORMATION Primary Insurance: Hilma Favors ,  [place] #: [id number] Pre-Cert / Josem Kaufmann required: No per Darrick Meigs Pre-Cert / Auth #: Ref# [id number]  SCHEDULE INFORMATION: Procedure has been scheduled as follows:  Date: 10/27/2018, Time: 10:30 Location: [facility]/ Dr. Gala Romney  This Gastroenterology Pre-Precedure Review Form is being routed to the following provider(s): Neil Crouch, PA

## 2018-08-18 NOTE — Patient Instructions (Addendum)
Katie Ingram  07-08-1955 MRN: [id number]     Procedure Date: 01/05/2019 Time to register: 1:00 Place to register: [facility] Stay Procedure Time: 2:00 Scheduled provider: R. Garfield Cornea, MD    PREPARATION FOR COLONOSCOPY WITH SUPREP BOWEL PREP KIT  Note: Suprep Bowel Prep Kit is a split-dose (2day) regimen. Consumption of BOTH 6-ounce bottles is required for a complete prep.  Please notify us immediately if you are diabetic, take iron supplements, or if you are on Coumadin or any other blood thinners.                                                                                                                                                  2 DAYS BEFORE PROCEDURE:  DATE: 01/03/2019   DAY: Monday Begin clear liquid diet AFTER your lunch meal. NO SOLID FOODS after this point.  1 DAY BEFORE PROCEDURE:  DATE: 01/04/2019  DAY: Tuesday Continue clear liquids the entire day - NO SOLID FOOD.   At 6:00pm: Complete steps 1 through 4 below, using ONE (1) 6-ounce bottle, before going to bed. Step 1:  Pour ONE (1) 6-ounce bottle of SUPREP liquid into the mixing container.  Step 2:  Add cool drinking water to the 16 ounce line on the container and mix.  Note: Dilute the solution concentrate as directed prior to use. Step 3:  DRINK ALL the liquid in the container. Step 4:  You MUST drink an additional two (2) or more 16 ounce containers of water over the next one (1) hour.   Continue clear liquids.  DAY OF PROCEDURE:   DATE: 01/05/2019  DAY: Wednesday If you take medications for your heart, blood pressure, or breathing, you may take these medications.   5 hours before your procedure at 9:00: Step 1:  Pour ONE (1) 6-ounce bottle of SUPREP liquid into the mixing container.  Step 2:  Add cool drinking water to the 16 ounce line on the container and mix.  Note: Dilute the solution concentrate as directed prior to use. Step 3:  DRINK ALL the liquid in the container. Step 4:  You MUST  drink an additional two (2) or more 16 ounce containers of water over the next one (1) hour. You MUST complete the final glass of water at least 3 hours before your colonoscopy. Nothing by mouth past 11:00.  You may take your morning medications with sip of water unless we have instructed otherwise.    Please see below for Dietary Information.  CLEAR LIQUIDS INCLUDE:  Water Jello (NOT red in color)   Ice Popsicles (NOT red in color)   Tea (sugar ok, no milk/cream) Powdered fruit flavored drinks  Coffee (sugar ok, no milk/cream) Gatorade/ Lemonade/ Kool-Aid  (NOT red in color)   Juice: apple, white grape, white cranberry Soft drinks  Clear bullion, consomme, broth (fat  free beef/chicken/vegetable)  Carbonated beverages (any kind)  Strained chicken noodle soup Hard Candy   Remember: Clear liquids are liquids that will allow you to see your fingers on the other side of a clear glass. Be sure liquids are NOT red in color, and not cloudy, but CLEAR.  DO NOT EAT OR DRINK ANY OF THE FOLLOWING:  Dairy products of any kind   Cranberry juice Tomato juice / V8 juice   Grapefruit juice Orange juice     Red grape juice  Do not eat any solid foods, including such foods as: cereal, oatmeal, yogurt, fruits, vegetables, creamed soups, eggs, bread, crackers, pureed foods in a blender, etc.   HELPFUL HINTS FOR DRINKING PREP SOLUTION:   Make sure prep is extremely cold. Mix and refrigerate the the morning of the prep. You may also put in the freezer.   You may try mixing some Crystal Light or Country Time Lemonade if you prefer. Mix in small amounts; add more if necessary.  Try drinking through a straw  Rinse mouth with water or a mouthwash between glasses, to remove after-taste.  Try sipping on a cold beverage /ice/ popsicles between glasses of prep.  Place a piece of sugar-free hard candy in mouth between glasses.  If you become nauseated, try consuming smaller amounts, or stretch out the time  between glasses. Stop for 30-60 minutes, then slowly start back drinking.     OTHER INSTRUCTIONS  You will need a responsible adult at least 62 years of age to accompany you and drive you home. This person must remain in the waiting room during your procedure. The hospital will cancel your procedure if you do not have a responsible adult with you.   1. Wear loose fitting clothing that is easily removed. 2. Leave jewelry and other valuables at home.  3. Remove all body piercing jewelry and leave at home. 4. Total time from sign-in until discharge is approximately 2-3 hours. 5. You should go home directly after your procedure and rest. You can resume normal activities the day after your procedure. 6. The day of your procedure you should not:  Drive  Make legal decisions  Operate machinery  Drink alcohol  Return to work   You may call the office (Dept: [phone]) before 5:00pm, or page the doctor on call [phone]) after 5:00pm, for further instructions, if necessary.   Insurance Information YOU WILL NEED TO CHECK WITH YOUR INSURANCE COMPANY FOR THE BENEFITS OF COVERAGE YOU HAVE FOR THIS PROCEDURE.  UNFORTUNATELY, NOT ALL INSURANCE COMPANIES HAVE BENEFITS TO COVER ALL OR PART OF THESE TYPES OF PROCEDURES.  IT IS YOUR RESPONSIBILITY TO CHECK YOUR BENEFITS, HOWEVER, WE WILL BE GLAD TO ASSIST YOU WITH ANY CODES YOUR INSURANCE COMPANY MAY NEED.    PLEASE NOTE THAT MOST INSURANCE COMPANIES WILL NOT COVER A SCREENING COLONOSCOPY FOR PEOPLE UNDER THE AGE OF 50  IF YOU HAVE BCBS INSURANCE, YOU MAY HAVE BENEFITS FOR A SCREENING COLONOSCOPY BUT IF POLYPS ARE FOUND THE DIAGNOSIS WILL CHANGE AND THEN YOU MAY HAVE A DEDUCTIBLE THAT WILL NEED TO BE MET. SO PLEASE MAKE SURE YOU CHECK YOUR BENEFITS FOR A SCREENING COLONOSCOPY AS WELL AS A DIAGNOSTIC COLONOSCOPY.

## 2018-08-19 NOTE — Addendum Note (Signed)
Addended by: Metro Kung on: 08/19/2018 02:46 PM   Modules accepted: Orders, SmartSet

## 2018-08-19 NOTE — Progress Notes (Signed)
Ok to schedule.

## 2018-09-02 ENCOUNTER — Encounter: Payer: Self-pay | Admitting: Family Medicine

## 2018-09-02 ENCOUNTER — Other Ambulatory Visit: Payer: Self-pay | Admitting: Family Medicine

## 2018-09-03 MED ORDER — LEVOTHYROXINE SODIUM 50 MCG PO TABS: ORAL_TABLET | ORAL | 0 refills | Status: DC

## 2018-09-03 MED ORDER — MONTELUKAST SODIUM 10 MG PO TABS: ORAL_TABLET | ORAL | 0 refills | Status: DC

## 2018-10-14 ENCOUNTER — Telehealth: Payer: Self-pay | Admitting: Family Medicine

## 2018-10-14 ENCOUNTER — Emergency Department (HOSPITAL_COMMUNITY)
Admission: EM | Admit: 2018-10-14 | Discharge: 2018-10-14 | Disposition: A | Discharge status: Home / Self Care | Payer: PRIVATE HEALTH INSURANCE | Attending: Emergency Medicine | Admitting: Emergency Medicine

## 2018-10-14 ENCOUNTER — Other Ambulatory Visit: Payer: Self-pay

## 2018-10-14 ENCOUNTER — Encounter (HOSPITAL_COMMUNITY): Payer: Self-pay | Admitting: Emergency Medicine

## 2018-10-14 DIAGNOSIS — Z79899 Other long term (current) drug therapy: Secondary | ICD-10-CM | POA: Diagnosis not present

## 2018-10-14 DIAGNOSIS — D1801 Hemangioma of skin and subcutaneous tissue: Secondary | ICD-10-CM | POA: Diagnosis not present

## 2018-10-14 DIAGNOSIS — E039 Hypothyroidism, unspecified: Secondary | ICD-10-CM | POA: Diagnosis not present

## 2018-10-14 DIAGNOSIS — K1370 Unspecified lesions of oral mucosa: Secondary | ICD-10-CM | POA: Diagnosis present

## 2018-10-14 NOTE — Telephone Encounter (Signed)
Pt has a place on her lip she is doing a virtual visit tomorrow with Dr. Nicki Reaper in regards to it. She is neighbors with Dr. Elonda Husky who gave her silver nitrate sticks to help get the stop off. The spot is still there and has a scab on it anytime she moves her lip the scab cracks and starts bleeding. She is out in the sun and wind all day with her job and she knows that is not helping. She would like to know what Dr. Nicki Reaper could recommend her putting on her lip that is OTC today until her virtual appt tomorrow. She has tried carmax and vaseline and that has not helped.

## 2018-10-14 NOTE — Telephone Encounter (Signed)
Discussed with pt. Pt states she is actually at the ED right now and has been seen and they are trying to get the ENT on call to see her. She wants to go ahead and cancel appt for tomorrow.

## 2018-10-14 NOTE — Discharge Instructions (Signed)
Go straight to Dr Janeice Robinson office. Do not eat or drink prior to going

## 2018-10-14 NOTE — ED Provider Notes (Signed)
[facility] EMERGENCY DEPARTMENT Provider Note   CSN: [id number] Arrival date & time: 10/14/18  1220    History   Chief Complaint Chief Complaint  Patient presents with  . Lip Laceration    HPI Katie Ingram is a 63 y.o. female.     HPI  63 year old female presents with bleeding from a lip lesion.  She states that she has had this lesion and others for quite some time.  Others such as near her nose have bled in the past and she is had to have these cauterized.  The lip lesion has never given her trouble until the last few days.  She has been outside a lot with the windy weather and thinks that this is causing it to start to bleed.  It has bled intermittently.  She lives next to a physician who let her use silver nitrate, which she used once in the morning and then once again in the evening.  It temporarily stopped the bleeding.  She tried to call an ENT but was unable to get through and could not see her primary doctor until tomorrow for a tele-visit so she went to get it checked out. No other areas of bleeding. No blood thinners. She's worried the bleeding is now subcutaneous.  Past Medical History:  Diagnosis Date  . Allergic rhinitis   . H/O goiter   . Hypothyroidism     Patient Active Problem List   Diagnosis Date Noted  . Systolic murmur 18/56/3149  . Rhinitis, allergic 01/18/2016  . Hypothyroidism 11/08/2012    Past Surgical History:  Procedure Laterality Date  . COLONOSCOPY    . DILATION AND CURETTAGE OF UTERUS    . ENDOMETRIAL ABLATION    . ESOPHAGOGASTRODUODENOSCOPY    . THYROID SURGERY    . TONSILLECTOMY AND ADENOIDECTOMY    . TUBAL LIGATION       OB History   No obstetric history on file.      Home Medications    Prior to Admission medications   Medication Sig Start Date End Date Taking? Authorizing Provider  Bacillus Coagulans-Inulin (ALIGN PREBIOTIC-PROBIOTIC PO) Take by mouth. One daily    [provider]  cetirizine (ZYRTEC) 10 MG  tablet Take 10 mg by mouth daily.    [provider]  fish oil-omega-3 fatty acids 1000 MG capsule Take 2 g by mouth daily.    [provider]  fluticasone (FLONASE) 50 MCG/ACT nasal spray USE 2 SPRAYS IN EACH       NOSTRIL DAILY AS NEEDED 10/01/15   Mikey Kirschner, MD  Glucosamine-Chondroitin (MOVE FREE PO) Take 1 tablet by mouth daily.    [provider]  levothyroxine (SYNTHROID, LEVOTHROID) 50 MCG tablet TAKE (1) TABLET BY MOUTH ONCE DAILY. 09/03/18   Mikey Kirschner, MD  montelukast (SINGULAIR) 10 MG tablet TAKE (1) TABLET BY MOUTH ONCE DAILY. 09/03/18   Mikey Kirschner, MD  Multiple Vitamin (MULTIVITAMIN) tablet Take 1 tablet by mouth daily.    [provider]  psyllium (METAMUCIL) 58.6 % powder Take 1 packet by mouth daily.    [provider]    Family History Family History  Problem Relation Age of Onset  . Hypertension Mother   . Arthritis Mother   . Hypertension Father   . Hyperlipidemia Father   . Cancer Other        colon  . Arthritis Maternal Grandmother     Social History Social History   Tobacco Use  . Smoking  status: Never Smoker  . Smokeless tobacco: Never Used  Substance Use Topics  . Alcohol use: Yes    Alcohol/week: 0.0 standard drinks    Comment: occas social  . Drug use: No     Allergies   Bactrim [sulfamethoxazole-trimethoprim]; Biaxin [clarithromycin]; Ceftin [cefuroxime axetil]; Compazine [prochlorperazine edisylate]; Floxacillin [flucloxacillin]; Levaquin [levofloxacin in d5w]; and Tobramycin   Review of Systems Review of Systems  Skin: Positive for wound.     Physical Exam Updated Vital Signs BP (!) 172/100 (BP Location: Right Arm)   Pulse 77   Temp (!) 97.5 F (36.4 C) (Oral)   Resp 18   Ht 5\' 8"  (1.727 m)   Wt 67.1 kg   SpO2 100%   BMI 22.50 kg/m   Physical Exam Vitals signs and nursing note reviewed.  Constitutional:      Appearance: She is well-developed. She is not  ill-appearing or diaphoretic.  HENT:     Head: Normocephalic.     Comments: No facial swelling, lip swelling or swelling of chin    Right Ear: External ear normal.     Left Ear: External ear normal.     Nose: Nose normal.     Mouth/Throat:     Comments: See picture. There appears to be a hemangioma to the mid lower lip. Currently partially covered from previous silver nitrate. One small area on right lateral aspect that appears a little friable but is not bleeding. Eyes:     General:        Right eye: No discharge.        Left eye: No discharge.  Pulmonary:     Effort: Pulmonary effort is normal.  Abdominal:     General: There is no distension.  Skin:    General: Skin is warm and dry.  Neurological:     Mental Status: She is alert.  Psychiatric:        Mood and Affect: Mood is not anxious.        ED Treatments / Results  Labs (all labs ordered are listed, but only abnormal results are displayed) Labs Reviewed - No data to display  EKG None  Radiology No results found.  Procedures Procedures (including critical care time)  Medications Ordered in ED Medications - No data to display   Initial Impression / Assessment and Plan / ED Course  I have reviewed the triage vital signs and the nursing notes.  Pertinent labs & imaging results that were available during my care of the patient were reviewed by me and considered in my medical decision making (see chart for details).        I discussed with Dr. Constance Holster, who recommends silver nitrate if currently bleeding (which it is not), otherwise he will see in his office this afternoon.  The patient is able to drive there now and was encouraged to go straight there.  Appears well otherwise.  This appears to be a hemangioma.  I do not see any swelling otherwise to suggest deeper bleeding/bruising.  Final Clinical Impressions(s) / ED Diagnoses   Final diagnoses:  Hemangioma of lip    ED Discharge Orders    None        Sherwood Gambler, MD 10/14/18 1541

## 2018-10-14 NOTE — ED Triage Notes (Signed)
Pt states that she has a blood vessel that has busted and is bleeding she tried a silver nitrate stick but now it looks like it is bleeding under her skin

## 2018-10-14 NOTE — Telephone Encounter (Signed)
So noted please cancel

## 2018-10-14 NOTE — Telephone Encounter (Signed)
So We can try Blistex  Another option for her as I am willing to move her virtual visit to the end of today which would be somewhere in the vicinity of 4 PM as long as that is suitable for the patient

## 2018-10-14 NOTE — Telephone Encounter (Signed)
appt canceled

## 2018-10-14 NOTE — Progress Notes (Signed)
Pt's procedure was re-scheduled to 01/05/2019 due to COVID 19.  Pt aware that we are mailing out new instructions.  Endo notified.

## 2018-10-15 ENCOUNTER — Ambulatory Visit: Payer: PRIVATE HEALTH INSURANCE | Admitting: Family Medicine

## 2018-11-02 ENCOUNTER — Ambulatory Visit (INDEPENDENT_AMBULATORY_CARE_PROVIDER_SITE_OTHER): Payer: PRIVATE HEALTH INSURANCE | Admitting: Family Medicine

## 2018-11-02 ENCOUNTER — Encounter: Payer: Self-pay | Admitting: Family Medicine

## 2018-11-02 ENCOUNTER — Other Ambulatory Visit: Payer: Self-pay

## 2018-11-02 VITALS — Temp 98.4°F | Wt 154.2 lb

## 2018-11-02 DIAGNOSIS — L739 Follicular disorder, unspecified: Secondary | ICD-10-CM | POA: Diagnosis not present

## 2018-11-02 DIAGNOSIS — R21 Rash and other nonspecific skin eruption: Secondary | ICD-10-CM

## 2018-11-02 MED ORDER — DOXYCYCLINE HYCLATE 100 MG PO TABS: 100.0000 mg | ORAL_TABLET | Freq: Two times a day (BID) | ORAL | 0 refills | Status: DC

## 2018-11-02 NOTE — Progress Notes (Signed)
   Subjective:    Patient ID: Katie Ingram, female    DOB: 09/04/1955, 63 y.o.   MRN: [id number]  HPI Patient arrives with possible singles on chest. The area is very painful and burns and stings. Patient also states she has a similar area on her labia and also had one on her thigh and a new area coming on her arm  Patient has similar eruption on right posterior buttock 1 year ago was prescribed Valtrex by her GYN doctor.  Has had her shingles vaccine.  Has an eruption on her anterior chest right posterior arm right posterior thigh and right anterior leg  No fever no chills Review of Systems See above    Objective:   Physical Exam  Alert vitals stable, NAD. Blood pressure good on repeat. HEENT normal. Lungs clear. Heart regular rate and rhythm. Discrete eruption left anterior chest pustular sensitive tender erythematous similar eruption without the pustules on the posterior thigh patient also has painful eruption on labia      Assessment & Plan:  Impression folliculitis/skin structure infection discussed plan local measures discussed doxycycline 100 twice daily 10 days symptom care discussed

## 2018-12-07 ENCOUNTER — Telehealth: Payer: Self-pay | Admitting: *Deleted

## 2018-12-07 NOTE — Telephone Encounter (Signed)
Left message with husband for pt to call us back.  Need to schedule COVID 19 screening.

## 2018-12-07 NOTE — Telephone Encounter (Signed)
Pt is scheduled for a COVID 19 screening on 12/31/2018.  Pt is aware to remain in quarantine once testing is done.  Pt voiced understanding.

## 2018-12-09 ENCOUNTER — Other Ambulatory Visit: Payer: Self-pay | Admitting: Family Medicine

## 2018-12-09 MED ORDER — MONTELUKAST SODIUM 10 MG PO TABS: ORAL_TABLET | ORAL | 0 refills | Status: DC

## 2018-12-09 MED ORDER — LEVOTHYROXINE SODIUM 50 MCG PO TABS: ORAL_TABLET | ORAL | 0 refills | Status: DC

## 2018-12-31 ENCOUNTER — Other Ambulatory Visit: Payer: Self-pay

## 2018-12-31 ENCOUNTER — Other Ambulatory Visit (HOSPITAL_COMMUNITY)
Admission: RE | Admit: 2018-12-31 | Discharge: 2018-12-31 | Disposition: A | Discharge status: Home / Self Care | Payer: PRIVATE HEALTH INSURANCE | Source: Ambulatory Visit | Attending: Internal Medicine | Admitting: Internal Medicine

## 2018-12-31 DIAGNOSIS — Z1159 Encounter for screening for other viral diseases: Secondary | ICD-10-CM | POA: Diagnosis present

## 2019-01-01 LAB — SARS CORONAVIRUS 2 (TAT 6-24 HRS): SARS Coronavirus 2: NEGATIVE

## 2019-01-05 ENCOUNTER — Encounter (HOSPITAL_COMMUNITY): Payer: Self-pay | Admitting: *Deleted

## 2019-01-05 ENCOUNTER — Ambulatory Visit (HOSPITAL_COMMUNITY)
Admission: RE | Admit: 2019-01-05 | Discharge: 2019-01-05 | Disposition: A | Discharge status: Home / Self Care | Payer: PRIVATE HEALTH INSURANCE | Attending: Internal Medicine | Admitting: Internal Medicine

## 2019-01-05 ENCOUNTER — Encounter (HOSPITAL_COMMUNITY)
Admission: RE | Disposition: A | Discharge status: Home / Self Care | Payer: Self-pay | Source: Home / Self Care | Attending: Internal Medicine

## 2019-01-05 ENCOUNTER — Other Ambulatory Visit: Payer: Self-pay

## 2019-01-05 DIAGNOSIS — Z1211 Encounter for screening for malignant neoplasm of colon: Secondary | ICD-10-CM | POA: Diagnosis not present

## 2019-01-05 DIAGNOSIS — D123 Benign neoplasm of transverse colon: Secondary | ICD-10-CM | POA: Insufficient documentation

## 2019-01-05 DIAGNOSIS — Z79890 Hormone replacement therapy (postmenopausal): Secondary | ICD-10-CM | POA: Diagnosis not present

## 2019-01-05 DIAGNOSIS — K635 Polyp of colon: Secondary | ICD-10-CM

## 2019-01-05 DIAGNOSIS — E039 Hypothyroidism, unspecified: Secondary | ICD-10-CM | POA: Insufficient documentation

## 2019-01-05 HISTORY — PX: COLONOSCOPY: SHX5424

## 2019-01-05 HISTORY — PX: POLYPECTOMY: SHX5525

## 2019-01-05 HISTORY — DX: Other seasonal allergic rhinitis: J30.2

## 2019-01-05 SURGERY — COLONOSCOPY
Anesthesia: Moderate Sedation

## 2019-01-05 MED ORDER — ONDANSETRON HCL 4 MG/2ML IJ SOLN
INTRAMUSCULAR | Status: AC
  Filled 2019-01-05: qty 2

## 2019-01-05 MED ORDER — SODIUM CHLORIDE 0.9% FLUSH
INTRAVENOUS | Status: AC
  Filled 2019-01-05: qty 10

## 2019-01-05 MED ORDER — MIDAZOLAM HCL 5 MG/5ML IJ SOLN
INTRAMUSCULAR | Status: DC | PRN
  Administered 2019-01-05: 2 mg via INTRAVENOUS
  Administered 2019-01-05 (×2): 1 mg via INTRAVENOUS
  Administered 2019-01-05: 2 mg via INTRAVENOUS

## 2019-01-05 MED ORDER — MEPERIDINE HCL 100 MG/ML IJ SOLN
INTRAMUSCULAR | Status: DC | PRN
  Administered 2019-01-05: 15 mg via INTRAVENOUS
  Administered 2019-01-05: 25 mg via INTRAVENOUS

## 2019-01-05 MED ORDER — MIDAZOLAM HCL 5 MG/5ML IJ SOLN
INTRAMUSCULAR | Status: AC
  Filled 2019-01-05: qty 10

## 2019-01-05 MED ORDER — MEPERIDINE HCL 50 MG/ML IJ SOLN
INTRAMUSCULAR | Status: AC
  Filled 2019-01-05: qty 1

## 2019-01-05 MED ORDER — SODIUM CHLORIDE 0.9 % IV SOLN
INTRAVENOUS | Status: DC
  Administered 2019-01-05: 13:00:00 via INTRAVENOUS

## 2019-01-05 MED ORDER — STERILE WATER FOR IRRIGATION IR SOLN
Status: DC | PRN
  Administered 2019-01-05: 2.5 mL

## 2019-01-05 MED ORDER — ONDANSETRON HCL 4 MG/2ML IJ SOLN
INTRAMUSCULAR | Status: DC | PRN
  Administered 2019-01-05: 4 mg via INTRAVENOUS

## 2019-01-05 NOTE — Discharge Instructions (Signed)
Colonoscopy Discharge Instructions  Read the instructions outlined below and refer to this sheet in the next few weeks. These discharge instructions provide you with general information on caring for yourself after you leave the hospital. Your doctor may also give you specific instructions. While your treatment has been planned according to the most current medical practices available, unavoidable complications occasionally occur. If you have any problems or questions after discharge, call Dr. Gala Romney at [phone]. ACTIVITY  You may resume your regular activity, but move at a slower pace for the next 24 hours.   Take frequent rest periods for the next 24 hours.   Walking will help get rid of the air and reduce the bloated feeling in your belly (abdomen).   No driving for 24 hours (because of the medicine (anesthesia) used during the test).    Do not sign any important legal documents or operate any machinery for 24 hours (because of the anesthesia used during the test).  NUTRITION  Drink plenty of fluids.   You may resume your normal diet as instructed by your doctor.   Begin with a light meal and progress to your normal diet. Heavy or fried foods are harder to digest and may make you feel sick to your stomach (nauseated).   Avoid alcoholic beverages for 24 hours or as instructed.  MEDICATIONS  You may resume your normal medications unless your doctor tells you otherwise.  WHAT YOU CAN EXPECT TODAY  Some feelings of bloating in the abdomen.   Passage of more gas than usual.   Spotting of blood in your stool or on the toilet paper.  IF YOU HAD POLYPS REMOVED DURING THE COLONOSCOPY:  No aspirin products for 7 days or as instructed.   No alcohol for 7 days or as instructed.   Eat a soft diet for the next 24 hours.  FINDING OUT THE RESULTS OF YOUR TEST Not all test results are available during your visit. If your test results are not back during the visit, make an appointment  with your caregiver to find out the results. Do not assume everything is normal if you have not heard from your caregiver or the medical facility. It is important for you to follow up on all of your test results.  SEEK IMMEDIATE MEDICAL ATTENTION IF:  You have more than a spotting of blood in your stool.   Your belly is swollen (abdominal distention).   You are nauseated or vomiting.   You have a temperature over 101.   You have abdominal pain or discomfort that is severe or gets worse throughout the day.   Colon polyp information provided  Further recommendations to follow pending review of pathology report  I discussed my findings and recommendations with Katie Ingram at [phone]     Colon Polyps  Polyps are tissue growths inside the body. Polyps can grow in many places, including the large intestine (colon). A polyp may be a round bump or a mushroom-shaped growth. You could have one polyp or several. Most colon polyps are noncancerous (benign). However, some colon polyps can become cancerous over time. Finding and removing the polyps early can help prevent this. What are the causes? The exact cause of colon polyps is not known. What increases the risk? You are more likely to develop this condition if you:  Have a family history of colon cancer or colon polyps.  Are older than 9 or older than 45 if you are African American.  Have inflammatory bowel disease,  such as ulcerative colitis or Crohn's disease.  Have certain hereditary conditions, such as: ? Familial adenomatous polyposis. ? Lynch syndrome. ? Turcot syndrome. ? Peutz-Jeghers syndrome.  Are overweight.  Smoke cigarettes.  Do not get enough exercise.  Drink too much alcohol.  Eat a diet that is high in fat and red meat and low in fiber.  Had childhood cancer that was treated with abdominal radiation. What are the signs or symptoms? Most polyps do not cause symptoms. If you have symptoms, they may  include:  Blood coming from your rectum when having a bowel movement.  Blood in your stool. The stool may look dark red or black.  Abdominal pain.  A change in bowel habits, such as constipation or diarrhea. How is this diagnosed? This condition is diagnosed with a colonoscopy. This is a procedure in which a lighted, flexible scope is inserted into the anus and then passed into the colon to examine the area. Polyps are sometimes found when a colonoscopy is done as part of routine cancer screening tests. How is this treated? Treatment for this condition involves removing any polyps that are found. Most polyps can be removed during a colonoscopy. Those polyps will then be tested for cancer. Additional treatment may be needed depending on the results of testing. Follow these instructions at home: Lifestyle  Maintain a healthy weight, or lose weight if recommended by your health care provider.  Exercise every day or as told by your health care provider.  Do not use any products that contain nicotine or tobacco, such as cigarettes and e-cigarettes. If you need help quitting, ask your health care provider.  If you drink alcohol, limit how much you have: ? 0-1 drink a day for women. ? 0-2 drinks a day for men.  Be aware of how much alcohol is in your drink. In the [place] one drink equals one 12 oz bottle of beer (355 mL), one 5 oz glass of wine (148 mL), or one 1 oz shot of hard liquor (44 mL). Eating and drinking   Eat foods that are high in fiber, such as fruits, vegetables, and whole grains.  Eat foods that are high in calcium and vitamin D, such as milk, cheese, yogurt, eggs, liver, fish, and broccoli.  Limit foods that are high in fat, such as fried foods and desserts.  Limit the amount of red meat and processed meat you eat, such as hot dogs, sausage, bacon, and lunch meats. General instructions  Keep all follow-up visits as told by your health care provider. This is  important. ? This includes having regularly scheduled colonoscopies. ? Talk to your health care provider about when you need a colonoscopy. Contact a health care provider if:  You have new or worsening bleeding during a bowel movement.  You have new or increased blood in your stool.  You have a change in bowel habits.  You lose weight for no known reason. Summary  Polyps are tissue growths inside the body. Polyps can grow in many places, including the colon.  Most colon polyps are noncancerous (benign), but some can become cancerous over time.  This condition is diagnosed with a colonoscopy.  Treatment for this condition involves removing any polyps that are found. Most polyps can be removed during a colonoscopy. This information is not intended to replace advice given to you by your health care provider. Make sure you discuss any questions you have with your health care provider. Document Released: 02/20/2004 Document Revised: 09/10/2017 Document  Reviewed: 09/10/2017 Elsevier Patient Education  [employer].

## 2019-01-05 NOTE — Op Note (Signed)
[facility] Patient Name: Katie Ingram Procedure Date: 01/05/2019 1:19 PM MRN: [id number] Date of Birth: 05-May-1956 Attending MD: Norvel Richards , MD CSN: [id number] Age: 63 Admit Type: Outpatient Procedure:                Colonoscopy Indications:              Screening for colorectal malignant neoplasm Providers:                Norvel Richards, MD, Lurline Del, RN, Nelma Rothman, Technician Referring MD:              Medicines:                Midazolam 6 mg IV, Meperidine 40 mg IV Complications:            No immediate complications. Estimated Blood Loss:     Estimated blood loss was minimal. Procedure:                Pre-Anesthesia Assessment:                           - Prior to the procedure, a History and Physical                            was performed, and patient medications and                            allergies were reviewed. The patient's tolerance of                            previous anesthesia was also reviewed. The risks                            and benefits of the procedure and the sedation                            options and risks were discussed with the patient.                            All questions were answered, and informed consent                            was obtained. Prior Anticoagulants: The patient has                            taken no previous anticoagulant or antiplatelet                            agents. ASA Grade Assessment: II - A patient with                            mild systemic disease. After reviewing the risks  and benefits, the patient was deemed in                            satisfactory condition to undergo the procedure.                           After obtaining informed consent, the colonoscope                            was passed under direct vision. Throughout the                            procedure, the patient's blood pressure, pulse, and                             oxygen saturations were monitored continuously. The                            CF-HQ190L ([id number]) scope was introduced through                            the anus and advanced to the the cecum, identified                            by appendiceal orifice and ileocecal valve. The                            colonoscopy was performed without difficulty. The                            patient tolerated the procedure well. The quality                            of the bowel preparation was adequate. Scope In: 1:38:23 PM Scope Out: 1:50:01 PM Scope Withdrawal Time: 0 hours 6 minutes 50 seconds  Total Procedure Duration: 0 hours 11 minutes 38 seconds  Findings:      The perianal and digital rectal examinations were normal.      Two sessile polyps were found in the splenic flexure. The polyps were 4       to 5 mm in size. These polyps were removed with a cold snare. Resection       and retrieval were complete. Estimated blood loss was minimal.      The exam was otherwise without abnormality on direct and retroflexion       views. Impression:               - Two 4 to 5 mm polyps at the splenic flexure,                            removed with a cold snare. Resected and retrieved.                           - The examination was otherwise normal on direct  and retroflexion views. Moderate Sedation:      Moderate (conscious) sedation was administered by the endoscopy nurse       and supervised by the endoscopist. The following parameters were       monitored: oxygen saturation, heart rate, blood pressure, respiratory       rate, EKG, adequacy of pulmonary ventilation, and response to care.       Total physician intraservice time was 18 minutes. Recommendation:           - Patient has a contact number available for                            emergencies. The signs and symptoms of potential                            delayed complications were discussed with the                             patient. Return to normal activities tomorrow.                            Written discharge instructions were provided to the                            patient.                           - Resume previous diet.                           - Continue present medications.                           - Repeat colonoscopy date to be determined after                            pending pathology results are reviewed for                            surveillance.                           - Return to GI office (date not yet determined). Procedure Code(s):        --- Professional ---                           [phone], Colonoscopy, flexible; with removal of                            tumor(s), polyp(s), or other lesion(s) by snare                            technique                           G0500, Moderate sedation services provided by the  same physician or other qualified health care                            professional performing a gastrointestinal                            endoscopic service that sedation supports,                            requiring the presence of an independent trained                            observer to assist in the monitoring of the                            patient's level of consciousness and physiological                            status; initial 15 minutes of intra-service time;                            patient age 52 years or older (additional time may                            be reported with [phone], as appropriate) Diagnosis Code(s):        --- Professional ---                           Z12.11, Encounter for screening for malignant                            neoplasm of colon                           K63.5, Polyp of colon CPT copyright 2019 American Medical Association. All rights reserved. The codes documented in this report are preliminary and upon coder review may  be revised to meet current  compliance requirements. Cristopher Estimable. Naviah Belfield, MD Norvel Richards, MD 01/05/2019 1:59:02 PM This report has been signed electronically. Number of Addenda: 0

## 2019-01-05 NOTE — H&P (Signed)
@LOGO @   Primary Care Physician:  Mikey Kirschner, MD Primary Gastroenterologist:  Dr. Gala Romney  Pre-Procedure History & Physical: HPI:  Katie Ingram is a 63 y.o. female is here for a screening colonoscopy.  No bowel symptoms.  Family history of colon cancer in 2 second-degree relatives.  [place]. Past Medical History:  Diagnosis Date  . Allergic rhinitis   . H/O goiter   . Hypothyroidism   . Seasonal allergies     Past Surgical History:  Procedure Laterality Date  . COLONOSCOPY    . DILATION AND CURETTAGE OF UTERUS    . ENDOMETRIAL ABLATION    . ESOPHAGOGASTRODUODENOSCOPY    . THYROID SURGERY    . TONSILLECTOMY AND ADENOIDECTOMY    . TUBAL LIGATION      Prior to Admission medications   Medication Sig Start Date End Date Taking? Authorizing Provider  Bacillus Coagulans-Inulin (ALIGN PREBIOTIC-PROBIOTIC PO) Take 1 capsule by mouth daily. One daily    Yes [provider]  cetirizine (ZYRTEC) 10 MG tablet Take 10 mg by mouth daily.   Yes [provider]  ELDERBERRY PO Take 1 each by mouth daily.   Yes [provider]  fish oil-omega-3 fatty acids 1000 MG capsule Take 2 g by mouth daily.   Yes [provider]  fluticasone (FLONASE) 50 MCG/ACT nasal spray USE 2 SPRAYS IN EACH       NOSTRIL DAILY AS NEEDED Patient taking differently: Place 2 sprays into both nostrils daily as needed for allergies.  10/01/15  Yes Mikey Kirschner, MD  Glucosamine-Chondroitin (MOVE FREE PO) Take 1 tablet by mouth daily.   Yes [provider]  levothyroxine (SYNTHROID) 50 MCG tablet TAKE (1) TABLET BY MOUTH ONCE DAILY. 12/09/18  Yes Mikey Kirschner, MD  montelukast (SINGULAIR) 10 MG tablet TAKE (1) TABLET BY MOUTH ONCE DAILY. 12/09/18  Yes Mikey Kirschner, MD  Multiple Vitamin (MULTIVITAMIN) tablet Take 1 tablet by mouth daily.   Yes [provider]  Polyethyl Glycol-Propyl Glycol (SYSTANE ULTRA) 0.4-0.3 %  SOLN Place 1 drop into both eyes 2 (two) times a day.   Yes [provider]  psyllium (METAMUCIL) 58.6 % powder Take 1 packet by mouth daily.   Yes [provider]    Allergies as of 08/19/2018 - Review Complete 08/18/2018  Allergen Reaction Noted  . Bactrim [sulfamethoxazole-trimethoprim] Itching 09/30/2012  . Biaxin [clarithromycin]  09/30/2012  . Ceftin [cefuroxime axetil]  09/30/2012  . Compazine [prochlorperazine edisylate]  09/30/2012  . Floxacillin [flucloxacillin]  09/30/2012  . Levaquin [levofloxacin in d5w]  04/07/2016  . Tobramycin  09/30/2012    Family History  Problem Relation Age of Onset  . Hypertension Mother   . Arthritis Mother   . Hypertension Father   . Hyperlipidemia Father   . Cancer Other        colon  . Colon cancer Other   . Arthritis Maternal Grandmother     Social History   Socioeconomic History  . Marital status: Married    Spouse name: Not on file  . Number of children: Not on file  . Years of education: Not on file  . Highest education level: Not on file  Occupational History  . Not on file  Social Needs  . Financial resource strain: Not on file  . Food insecurity    Worry: Not on file    Inability: Not on file  . Transportation needs    Medical: Not on file  Non-medical: Not on file  Tobacco Use  . Smoking status: Never Smoker  . Smokeless tobacco: Never Used  Substance and Sexual Activity  . Alcohol use: Yes    Alcohol/week: 0.0 standard drinks    Comment: occas social  . Drug use: No  . Sexual activity: Yes    Birth control/protection: Surgical, Post-menopausal  Lifestyle  . Physical activity    Days per week: Not on file    Minutes per session: Not on file  . Stress: Not on file  Relationships  . Social Herbalist on phone: Not on file    Gets together: Not on file    Attends religious service: Not on file    Active member of club or organization: Not on file    Attends meetings of clubs  or organizations: Not on file    Relationship status: Not on file  . Intimate partner violence    Fear of current or ex partner: Not on file    Emotionally abused: Not on file    Physically abused: Not on file    Forced sexual activity: Not on file  Other Topics Concern  . Not on file  Social History Narrative  . Not on file    Review of Systems: See HPI, otherwise negative ROS  Physical Exam: BP (!) 173/96   Pulse 74   Temp 98.5 F (36.9 C) (Oral)   Resp 12   Ht 5\' 7"  (1.702 m)   Wt 68 kg   SpO2 100%   BMI 23.49 kg/m  General:   Alert,  Well-developed, well-nourished, pleasant and cooperative in NAD Head:  Normocephalic and atraumatic.  acute distress. Heart:  Regular rate and rhythm; no murmurs, clicks, rubs,  or gallops. Abdomen:  Soft, nontender and nondistended. No masses, hepatosplenomegaly or hernias noted. Normal bowel sounds, without guarding, and without rebound.   Impression/Plan: Katie Ingram is now here to undergo a screening colonoscopy.   Risks, benefits, limitations, imponderables and alternatives regarding colonoscopy have been reviewed with the patient. Questions have been answered. All parties agreeable.     Notice:  This dictation was prepared with Dragon dictation along with smaller phrase technology. Any transcriptional errors that result from this process are unintentional and may not be corrected upon review.

## 2019-01-08 ENCOUNTER — Encounter: Payer: Self-pay | Admitting: Internal Medicine

## 2019-01-13 ENCOUNTER — Encounter (HOSPITAL_COMMUNITY): Payer: Self-pay | Admitting: Internal Medicine

## 2019-03-07 ENCOUNTER — Other Ambulatory Visit: Payer: Self-pay | Admitting: Family Medicine

## 2019-03-07 MED ORDER — MONTELUKAST SODIUM 10 MG PO TABS: ORAL_TABLET | ORAL | 1 refills | Status: DC

## 2019-03-07 MED ORDER — LEVOTHYROXINE SODIUM 50 MCG PO TABS: ORAL_TABLET | ORAL | 1 refills | Status: DC

## 2019-03-07 NOTE — Telephone Encounter (Signed)
Yes plus one ref ck ed in feb

## 2019-05-19 ENCOUNTER — Other Ambulatory Visit: Payer: Self-pay

## 2019-05-19 ENCOUNTER — Encounter: Payer: Self-pay | Admitting: Family Medicine

## 2019-05-19 ENCOUNTER — Ambulatory Visit (INDEPENDENT_AMBULATORY_CARE_PROVIDER_SITE_OTHER): Payer: PRIVATE HEALTH INSURANCE | Admitting: Family Medicine

## 2019-05-19 DIAGNOSIS — Z20828 Contact with and (suspected) exposure to other viral communicable diseases: Secondary | ICD-10-CM

## 2019-05-19 DIAGNOSIS — J329 Chronic sinusitis, unspecified: Secondary | ICD-10-CM

## 2019-05-19 DIAGNOSIS — J310 Chronic rhinitis: Secondary | ICD-10-CM

## 2019-05-19 DIAGNOSIS — Z20822 Contact with and (suspected) exposure to covid-19: Secondary | ICD-10-CM

## 2019-05-19 MED ORDER — AMOXICILLIN 500 MG PO CAPS: ORAL_CAPSULE | ORAL | 0 refills | Status: DC

## 2019-05-19 NOTE — Progress Notes (Signed)
   Subjective:  Audio plus video  Patient ID: Katie Ingram, female    DOB: 06-Jul-1955, 63 y.o.   MRN: [id number]  Sore Throat  This is a new problem. Episode onset: about 3 weeks. The pain is worse on the right side. Associated symptoms comments: Ear pain, sore throat, pressure around right eye, crackly sound in sound, headache over right eye, cough due to dry and scratchy throat. Treatments tried: Sudafed. The treatment provided mild relief.   Virtual Visit via Telephone Note  I connected with Katie Ingram on 05/19/19 at  9:00 AM EST by telephone and verified that I am speaking with the correct person using two identifiers.  Location: Patient: home Provider: office   I discussed the limitations, risks, security and privacy concerns of performing an evaluation and management service by telephone and the availability of in person appointments. I also discussed with the patient that there may be a patient responsible charge related to this service. The patient expressed understanding and agreed to proceed.   History of Present Illness:    Observations/Objective:   Assessment and Plan:   Follow Up Instructions:    I discussed the assessment and treatment plan with the patient. The patient was provided an opportunity to ask questions and all were answered. The patient agreed with the plan and demonstrated an understanding of the instructions.   The patient was advised to call back or seek an in-person evaluation if the symptoms worsen or if the condition fails to improve as anticipated.  I provided 18 minutes of non-face-to-face time during this encounter.   Vicente Males, LPN  Progressive symptoms in recent weeks.  Congestion.  Stuffiness.  Drainage.  Now frontal headache.  Sharp at times achy at times.  Worse with coughing sneezing no shortness of breath no chest pain no throat discomfort  Review of Systems See above    Objective:   Physical Exam  Virtual       Assessment & Plan:  Impression probable rhinosinusitis with potential symptoms consistent with COVID-19.  Discussed at length.  Antibiotics prescribed.  Symptom care discussed.  Warning signs discussed.  COVID-19 testing encouraged patient states will do

## 2019-05-20 LAB — NOVEL CORONAVIRUS, NAA: SARS-CoV-2, NAA: NOT DETECTED

## 2019-06-09 ENCOUNTER — Telehealth: Payer: Self-pay | Admitting: Family Medicine

## 2019-06-09 MED ORDER — DOXYCYCLINE HYCLATE 100 MG PO TABS: ORAL_TABLET | ORAL | 0 refills | Status: DC

## 2019-06-09 NOTE — Telephone Encounter (Signed)
Pt contacted and verbalized understanding. Medication sent to pharmacy  

## 2019-06-09 NOTE — Telephone Encounter (Signed)
Do let pt know we generally do not start with 14 days, and I do not know anyone who does, we reserve 14 days for chronic or resistant situations. Doxy 100 bid 14 d

## 2019-06-09 NOTE — Telephone Encounter (Signed)
Pt had virtual on 12/10 she was given a 10 day antibiotic that helped some but now the green drainage is back coming out of the right nostril, eye and ear pressure and some right ear pain as well. She states normally she gets a 14 day stronger antibiotic but on 12/10 was given a 10 day antibiotic.   [place], [place]

## 2019-06-13 ENCOUNTER — Other Ambulatory Visit: Payer: Self-pay | Admitting: Family Medicine

## 2019-07-14 ENCOUNTER — Encounter: Payer: Self-pay | Admitting: Family Medicine

## 2019-09-07 ENCOUNTER — Telehealth: Payer: Self-pay | Admitting: Family Medicine

## 2019-09-07 DIAGNOSIS — E039 Hypothyroidism, unspecified: Secondary | ICD-10-CM

## 2019-09-07 DIAGNOSIS — Z13220 Encounter for screening for lipoid disorders: Secondary | ICD-10-CM

## 2019-09-07 DIAGNOSIS — R7301 Impaired fasting glucose: Secondary | ICD-10-CM

## 2019-09-07 DIAGNOSIS — Z79899 Other long term (current) drug therapy: Secondary | ICD-10-CM

## 2019-09-07 DIAGNOSIS — R748 Abnormal levels of other serum enzymes: Secondary | ICD-10-CM

## 2019-09-07 NOTE — Telephone Encounter (Signed)
Last labs completed on 07/16/2018 TSH BMET Hepatic Lipid and Vit D. Please advise. Thank you

## 2019-09-07 NOTE — Telephone Encounter (Signed)
Pt has CPE scheduled 5/28 with Hoyle Sauer and would like lab work done before appt.

## 2019-09-09 NOTE — Telephone Encounter (Signed)
Please order: CBC Met 7 Liver Lipid  TSH   Thanks!

## 2019-09-09 NOTE — Telephone Encounter (Signed)
Lab work ordered in [employer]. Patient notified.

## 2019-09-13 ENCOUNTER — Other Ambulatory Visit: Payer: Self-pay | Admitting: Family Medicine

## 2019-09-13 NOTE — Telephone Encounter (Signed)
Wellness 07/23/18

## 2019-09-15 ENCOUNTER — Other Ambulatory Visit (HOSPITAL_COMMUNITY): Payer: Self-pay | Admitting: Family Medicine

## 2019-09-15 DIAGNOSIS — Z1231 Encounter for screening mammogram for malignant neoplasm of breast: Secondary | ICD-10-CM

## 2019-10-06 ENCOUNTER — Ambulatory Visit (HOSPITAL_COMMUNITY)
Admission: RE | Admit: 2019-10-06 | Discharge: 2019-10-06 | Disposition: A | Discharge status: Home / Self Care | Payer: PRIVATE HEALTH INSURANCE | Source: Ambulatory Visit | Attending: Family Medicine | Admitting: Family Medicine

## 2019-10-06 ENCOUNTER — Other Ambulatory Visit: Payer: Self-pay

## 2019-10-06 DIAGNOSIS — Z1231 Encounter for screening mammogram for malignant neoplasm of breast: Secondary | ICD-10-CM | POA: Diagnosis present

## 2019-10-27 LAB — CBC WITH DIFFERENTIAL/PLATELET
Basophils Absolute: 0 10*3/uL (ref 0.0–0.2)
Basos: 1 %
EOS (ABSOLUTE): 0.1 10*3/uL (ref 0.0–0.4)
Eos: 2 %
Hematocrit: 48.6 % — ABNORMAL HIGH (ref 34.0–46.6)
Hemoglobin: 16.5 g/dL — ABNORMAL HIGH (ref 11.1–15.9)
Immature Grans (Abs): 0 10*3/uL (ref 0.0–0.1)
Immature Granulocytes: 0 %
Lymphocytes Absolute: 1.2 10*3/uL (ref 0.7–3.1)
Lymphs: 30 %
MCH: 31.5 pg (ref 26.6–33.0)
MCHC: 34 g/dL (ref 31.5–35.7)
MCV: 93 fL (ref 79–97)
Monocytes Absolute: 0.4 10*3/uL (ref 0.1–0.9)
Monocytes: 10 %
Neutrophils Absolute: 2.4 10*3/uL (ref 1.4–7.0)
Neutrophils: 57 %
Platelets: 227 10*3/uL (ref 150–450)
RBC: 5.23 x10E6/uL (ref 3.77–5.28)
RDW: 12.9 % (ref 11.7–15.4)
WBC: 4.2 10*3/uL (ref 3.4–10.8)

## 2019-10-27 LAB — BASIC METABOLIC PANEL
BUN/Creatinine Ratio: 17 (ref 12–28)
BUN: 14 mg/dL (ref 8–27)
CO2: 23 mmol/L (ref 20–29)
Calcium: 9.7 mg/dL (ref 8.7–10.3)
Chloride: 100 mmol/L (ref 96–106)
Creatinine, Ser: 0.84 mg/dL (ref 0.57–1.00)
GFR calc Af Amer: 86 mL/min/{1.73_m2} (ref >59)
GFR calc non Af Amer: 75 mL/min/{1.73_m2} (ref >59)
Glucose: 102 mg/dL — ABNORMAL HIGH (ref 65–99)
Potassium: 4.1 mmol/L (ref 3.5–5.2)
Sodium: 138 mmol/L (ref 134–144)

## 2019-10-27 LAB — HEPATIC FUNCTION PANEL
ALT: 31 IU/L (ref 0–32)
AST: 26 IU/L (ref 0–40)
Albumin: 4.9 g/dL — ABNORMAL HIGH (ref 3.8–4.8)
Alkaline Phosphatase: 55 IU/L (ref 48–121)
Bilirubin Total: 0.5 mg/dL (ref 0.0–1.2)
Bilirubin, Direct: 0.13 mg/dL (ref 0.00–0.40)
Total Protein: 7.2 g/dL (ref 6.0–8.5)

## 2019-10-27 LAB — LIPID PANEL
Chol/HDL Ratio: 3.4 ratio (ref 0.0–4.4)
Cholesterol, Total: 234 mg/dL — ABNORMAL HIGH (ref 100–199)
HDL: 68 mg/dL (ref >39)
LDL Chol Calc (NIH): 150 mg/dL — ABNORMAL HIGH (ref 0–99)
Triglycerides: 93 mg/dL (ref 0–149)
VLDL Cholesterol Cal: 16 mg/dL (ref 5–40)

## 2019-10-27 LAB — TSH: TSH: 1.48 u[IU]/mL (ref 0.450–4.500)

## 2019-11-04 ENCOUNTER — Ambulatory Visit (INDEPENDENT_AMBULATORY_CARE_PROVIDER_SITE_OTHER): Payer: PRIVATE HEALTH INSURANCE | Admitting: Nurse Practitioner

## 2019-11-04 ENCOUNTER — Other Ambulatory Visit: Payer: Self-pay

## 2019-11-04 VITALS — BP 128/78 | Temp 98.2°F | Ht 66.0 in | Wt 158.2 lb

## 2019-11-04 DIAGNOSIS — Z01419 Encounter for gynecological examination (general) (routine) without abnormal findings: Secondary | ICD-10-CM

## 2019-11-04 DIAGNOSIS — Z124 Encounter for screening for malignant neoplasm of cervix: Secondary | ICD-10-CM | POA: Diagnosis not present

## 2019-11-04 DIAGNOSIS — Z1151 Encounter for screening for human papillomavirus (HPV): Secondary | ICD-10-CM

## 2019-11-04 DIAGNOSIS — F5104 Psychophysiologic insomnia: Secondary | ICD-10-CM

## 2019-11-04 MED ORDER — TRAZODONE HCL 50 MG PO TABS: 25.0000 mg | ORAL_TABLET | Freq: Every evening | ORAL | 2 refills | Status: DC | PRN
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
## 2019-11-04 NOTE — Progress Notes (Signed)
Subjective:    Patient ID: Katie Ingram, female    DOB: June 12, 1955, 64 y.o.   MRN: [id number]  HPI  Presents today for annual wellness exam. Last vision exam in 2020 and dental exam six months ago. Reports being in menopause, not sexually active. History of endometrial ablation. Colonoscopy up to date. Denies smoking and alcohol use. States she is having difficulty sleeping and requesting medication to help with sleep. No relief with Melatonin.     Review of Systems  Constitutional: Negative for fever and unexpected weight change.  Respiratory: Negative for cough, chest tightness and shortness of breath.   Cardiovascular: Negative for chest pain.  Gastrointestinal: Negative for abdominal pain, blood in stool, constipation, diarrhea, nausea and vomiting.  Genitourinary: Negative for difficulty urinating, dysuria, enuresis, frequency, genital sores, pelvic pain, urgency, vaginal bleeding and vaginal discharge.  Psychiatric/Behavioral: Positive for sleep disturbance.   Depression screen [facility] 2/9 11/04/2019 07/23/2018 06/04/2017  Decreased Interest 0 0 0  Down, Depressed, Hopeless 0 0 0  PHQ - 2 Score 0 0 0  Altered sleeping - - 1  Tired, decreased energy - - 1  Change in appetite - - 1  Feeling bad or failure about yourself  - - 0  Trouble concentrating - - 0  Moving slowly or fidgety/restless - - 0  Suicidal thoughts - - 0  PHQ-9 Score - - 3        Objective:   Physical Exam Vitals reviewed.  Constitutional:      General: She is not in acute distress.    Appearance: Normal appearance.  Neck:     Thyroid: No thyroid mass, thyromegaly or thyroid tenderness.  Cardiovascular:     Rate and Rhythm: Normal rate and regular rhythm.     Heart sounds: Normal heart sounds.  Pulmonary:     Effort: Pulmonary effort is normal.     Breath sounds: Normal breath sounds.  Chest:     Breasts:        Right: Normal. No inverted nipple, mass, skin change or tenderness.        Left: Normal.  No inverted nipple, mass, skin change or tenderness.  Abdominal:     General: There is no distension.     Palpations: Abdomen is soft.     Tenderness: There is no abdominal tenderness.  Genitourinary:    Labia:        Right: No rash or tenderness.        Left: No rash or tenderness.      Comments: External GU: no rashes or lesions. Vagina: no discharge. Cervix: normal in appearance. No CMT. Bimanual exam: no tenderness or obvious masses.  Lymphadenopathy:     Cervical: No cervical adenopathy.     Right cervical: No superficial cervical adenopathy.    Left cervical: No superficial cervical adenopathy.     Upper Body:     Right upper body: No supraclavicular, axillary or pectoral adenopathy.     Left upper body: No supraclavicular, axillary or pectoral adenopathy.  Skin:    General: Skin is warm and dry.     Findings: No petechiae or rash.  Neurological:     Mental Status: She is alert.  Psychiatric:        Attention and Perception: Attention normal.        Mood and Affect: Mood and affect normal.        Speech: Speech normal.        Behavior: Behavior normal.  Thought Content: Thought content normal.        Judgment: Judgment normal.    Recent Results (from the past 2160 hour(s))  CBC with Differential/Platelet     Status: Abnormal   Collection Time: 10/26/19  8:17 AM  Result Value Ref Range   WBC 4.2 3.4 - 10.8 x10E3/uL   RBC 5.23 3.77 - 5.28 x10E6/uL   Hemoglobin 16.5 (H) 11.1 - 15.9 g/dL   Hematocrit 48.6 (H) 34.0 - 46.6 %   MCV 93 79 - 97 fL   MCH 31.5 26.6 - 33.0 pg   MCHC 34.0 31.5 - 35.7 g/dL   RDW 12.9 11.7 - 15.4 %   Platelets 227 150 - 450 x10E3/uL   Neutrophils 57 Not Estab. %   Lymphs 30 Not Estab. %   Monocytes 10 Not Estab. %   Eos 2 Not Estab. %   Basos 1 Not Estab. %   Neutrophils Absolute 2.4 1.4 - 7.0 x10E3/uL   Lymphocytes Absolute 1.2 0.7 - 3.1 x10E3/uL   Monocytes Absolute 0.4 0.1 - 0.9 x10E3/uL   EOS (ABSOLUTE) 0.1 0.0 - 0.4 x10E3/uL    Basophils Absolute 0.0 0.0 - 0.2 x10E3/uL   Immature Granulocytes 0 Not Estab. %   Immature Grans (Abs) 0.0 0.0 - 0.1 [id number]  Basic metabolic panel     Status: Abnormal   Collection Time: 10/26/19  8:17 AM  Result Value Ref Range   Glucose 102 (H) 65 - 99 mg/dL   BUN 14 8 - 27 mg/dL   Creatinine, Ser 0.84 0.57 - 1.00 mg/dL   GFR calc non Af Amer 75 >59 mL/min/1.73   GFR calc Af Amer 86 >59 mL/min/1.73    Comment: **[facility] currently reports eGFR in compliance with the current**   recommendations of the [employer]. [facility] will   update reporting as new guidelines are published from the NKF-ASN   Task force.    BUN/Creatinine Ratio 17 12 - 28   Sodium 138 134 - 144 mmol/L   Potassium 4.1 3.5 - 5.2 mmol/L   Chloride 100 96 - 106 mmol/L   CO2 23 20 - 29 mmol/L   Calcium 9.7 8.7 - 10.3 mg/dL  Hepatic function panel     Status: Abnormal   Collection Time: 10/26/19  8:17 AM  Result Value Ref Range   Total Protein 7.2 6.0 - 8.5 g/dL   Albumin 4.9 (H) 3.8 - 4.8 g/dL   Bilirubin Total 0.5 0.0 - 1.2 mg/dL   Bilirubin, Direct 0.13 0.00 - 0.40 mg/dL   Alkaline Phosphatase 55 48 - 121 IU/L    Comment:               **Please note reference interval change**   AST 26 0 - 40 IU/L   ALT 31 0 - 32 IU/L  Lipid panel     Status: Abnormal   Collection Time: 10/26/19  8:17 AM  Result Value Ref Range   Cholesterol, Total 234 (H) 100 - 199 mg/dL   Triglycerides 93 0 - 149 mg/dL   HDL 68 >39 mg/dL   VLDL Cholesterol Cal 16 5 - 40 mg/dL   LDL Chol Calc (NIH) 150 (H) 0 - 99 mg/dL   Chol/HDL Ratio 3.4 0.0 - 4.4 ratio    Comment:                                   T.  Chol/HDL Ratio                                             Men  Women                               1/2 Avg.Risk  3.4    3.3                                   Avg.Risk  5.0    4.4                                2X Avg.Risk  9.6    7.1                                3X Avg.Risk 23.4   11.0   TSH     Status: None    Collection Time: 10/26/19  8:17 AM  Result Value Ref Range   TSH 1.480 0.450 - 4.500 uIU/mL   Reviewed labs with patient.        Assessment & Plan:   Problem List Items Addressed This Visit      Other   Chronic insomnia    Other Visit Diagnoses    Well woman exam    -  Primary   Relevant Orders   IGP, Aptima HPV   Screening for HPV (human papillomavirus)       Relevant Orders   IGP, Aptima HPV   Screening for cervical cancer       Relevant Orders   IGP, Aptima HPV     Meds ordered this encounter  Medications  . traZODone (DESYREL) 50 MG tablet    Sig: Take 0.5-1 tablets (25-50 mg total) by mouth at bedtime as needed for sleep.    Dispense:  30 tablet    Refill:  2    Order Specific Question:   Supervising Provider    Answer:   Sallee Lange A [[id number]]   Add Trazodone as directed prn sleep. DC med and contact office if any problems or if no improvement.  Recommend considering statin 3 days per week for LDL elevation. Defers at this time. Will recheck in one year and discuss again at that time. Continue other meds as directed.  DEXA 2019 was normal. Plan repeat next year.  Return in about 1 year (around 11/03/2020) for physical.

## 2019-11-05 ENCOUNTER — Encounter: Payer: Self-pay | Admitting: Nurse Practitioner

## 2019-11-08 LAB — IGP, APTIMA HPV: HPV Aptima: NEGATIVE

## 2019-12-11 ENCOUNTER — Other Ambulatory Visit: Payer: Self-pay | Admitting: Family Medicine

## 2019-12-13 MED ORDER — MONTELUKAST SODIUM 10 MG PO TABS: ORAL_TABLET | ORAL | 0 refills | Status: DC

## 2019-12-13 MED ORDER — LEVOTHYROXINE SODIUM 50 MCG PO TABS: ORAL_TABLET | ORAL | 0 refills | Status: DC

## 2019-12-13 NOTE — Telephone Encounter (Signed)
Wellness on 11/04/19

## 2020-03-07 ENCOUNTER — Other Ambulatory Visit: Payer: Self-pay | Admitting: Family Medicine

## 2020-03-08 MED ORDER — LEVOTHYROXINE SODIUM 50 MCG PO TABS: ORAL_TABLET | ORAL | 0 refills | Status: DC

## 2020-03-08 MED ORDER — MONTELUKAST SODIUM 10 MG PO TABS: ORAL_TABLET | ORAL | 0 refills | Status: DC

## 2020-03-08 NOTE — Telephone Encounter (Signed)
Pt has appt on 10/15

## 2020-03-08 NOTE — Telephone Encounter (Signed)
Please call and schedule appt with dr taylor to get established and then route back.

## 2020-03-23 ENCOUNTER — Ambulatory Visit (INDEPENDENT_AMBULATORY_CARE_PROVIDER_SITE_OTHER): Payer: PRIVATE HEALTH INSURANCE | Admitting: Family Medicine

## 2020-03-23 ENCOUNTER — Encounter: Payer: Self-pay | Admitting: Family Medicine

## 2020-03-23 VITALS — BP 122/82 | HR 66 | Temp 98.3°F | Ht 66.0 in | Wt 157.8 lb

## 2020-03-23 DIAGNOSIS — J309 Allergic rhinitis, unspecified: Secondary | ICD-10-CM

## 2020-03-23 DIAGNOSIS — E039 Hypothyroidism, unspecified: Secondary | ICD-10-CM

## 2020-03-23 DIAGNOSIS — F5104 Psychophysiologic insomnia: Secondary | ICD-10-CM | POA: Diagnosis not present

## 2020-03-23 DIAGNOSIS — Z23 Encounter for immunization: Secondary | ICD-10-CM

## 2020-03-23 MED ORDER — LEVOTHYROXINE SODIUM 50 MCG PO TABS: ORAL_TABLET | ORAL | 2 refills | Status: DC

## 2020-03-23 MED ORDER — MONTELUKAST SODIUM 10 MG PO TABS: ORAL_TABLET | ORAL | 2 refills | Status: DC

## 2020-03-23 MED ORDER — LEVOTHYROXINE SODIUM 50 MCG PO TABS
ORAL_TABLET | ORAL | 2 refills | Status: DC
Start: 2020-03-23 — End: 2020-03-23

## 2020-03-23 MED ORDER — MONTELUKAST SODIUM 10 MG PO TABS
ORAL_TABLET | ORAL | 2 refills | Status: DC
Start: 2020-03-23 — End: 2020-03-23

## 2020-03-23 NOTE — Progress Notes (Signed)
Patient ID: Katie Ingram, female    DOB: January 17, 1956, 64 y.o.   MRN: [id number]   Chief Complaint  Patient presents with  . Hypothyroidism    follow up- establish care   Subjective:    HPI  Pt seen for establish care and hypothyroidism.  Pt has wellness visit with Hoyle Sauer NP 1x per year in May.  Has been doing well.  No new concerns.  F/u hypothyroidism- Pt has been on levothyroxine for a while.  Not having any hair loss, diarrhea/consitpation, or palpations,depression or anxiety.  Pt had labs done in 5/21.  TSH -normal.  Uses singulair daily for sinus/allergies.  Doing well and needing a refill.  Insomnia- was given trazodone to try on last visit. Pt couldn't tell a difference when taking it for sleep.  Pt not taking it at this time.  Medical History Carena has a past medical history of Allergic rhinitis, H/O goiter, Hypothyroidism, and Seasonal allergies.   Outpatient Encounter Medications as of 03/23/2020  Medication Sig  . cetirizine (ZYRTEC) 10 MG tablet Take 10 mg by mouth daily.  . Bacillus Coagulans-Inulin (ALIGN PREBIOTIC-PROBIOTIC PO) Take 1 capsule by mouth daily. One daily   . ELDERBERRY PO Take 1 each by mouth daily.  . fish oil-omega-3 fatty acids 1000 MG capsule Take 2 g by mouth daily.  . fluticasone (FLONASE) 50 MCG/ACT nasal spray USE 2 SPRAYS IN EACH       NOSTRIL DAILY AS NEEDED (Patient taking differently: Place 2 sprays into both nostrils daily as needed for allergies. )  . Glucosamine-Chondroitin (MOVE FREE PO) Take 1 tablet by mouth daily.  Marland Kitchen levothyroxine (SYNTHROID) 50 MCG tablet 1 daily  . montelukast (SINGULAIR) 10 MG tablet 1 daily  . Multiple Vitamin (MULTIVITAMIN) tablet Take 1 tablet by mouth daily.  Vladimir Faster Glycol-Propyl Glycol (SYSTANE ULTRA) 0.4-0.3 % SOLN Place 1 drop into both eyes 2 (two) times a day.  . psyllium (METAMUCIL) 58.6 % powder Take 1 packet by mouth daily.  . [DISCONTINUED] levocetirizine (XYZAL) 5 MG tablet Take 5 mg  by mouth every evening.  . [DISCONTINUED] levothyroxine (SYNTHROID) 50 MCG tablet 1 daily  . [DISCONTINUED] levothyroxine (SYNTHROID) 50 MCG tablet 1 daily  . [DISCONTINUED] montelukast (SINGULAIR) 10 MG tablet 1 daily  . [DISCONTINUED] montelukast (SINGULAIR) 10 MG tablet 1 daily  . [DISCONTINUED] traZODone (DESYREL) 50 MG tablet Take 0.5-1 tablets (25-50 mg total) by mouth at bedtime as needed for sleep.   No facility-administered encounter medications on file as of 03/23/2020.     Review of Systems  Constitutional: Negative for chills and fever.  HENT: Negative for congestion, rhinorrhea and sore throat.   Respiratory: Negative for cough, shortness of breath and wheezing.   Cardiovascular: Negative for chest pain and leg swelling.  Gastrointestinal: Negative for abdominal pain, diarrhea, nausea and vomiting.  Genitourinary: Negative for dysuria and frequency.  Musculoskeletal: Negative for arthralgias and back pain.  Skin: Negative for rash.  Neurological: Negative for dizziness, weakness and headaches.     Vitals BP 122/82   Pulse 66   Temp 98.3 F (36.8 C) (Oral)   Ht 5\' 6"  (1.676 m)   Wt 157 lb 12.8 oz (71.6 kg)   SpO2 100%   BMI 25.47 kg/m   Objective:   Physical Exam Vitals and nursing note reviewed.  Constitutional:      General: She is not in acute distress.    Appearance: Normal appearance. She is not ill-appearing.  HENT:     Head:  Normocephalic and atraumatic.  Eyes:     Extraocular Movements: Extraocular movements intact.     Conjunctiva/sclera: Conjunctivae normal.     Pupils: Pupils are equal, round, and reactive to light.  Cardiovascular:     Rate and Rhythm: Normal rate and regular rhythm.     Pulses: Normal pulses.     Heart sounds: Normal heart sounds.  Pulmonary:     Effort: Pulmonary effort is normal.     Breath sounds: Normal breath sounds. No wheezing, rhonchi or rales.  Musculoskeletal:        General: Normal range of motion.     Right  lower leg: No edema.     Left lower leg: No edema.  Skin:    General: Skin is warm and dry.     Findings: No lesion or rash.  Neurological:     General: No focal deficit present.     Mental Status: She is alert and oriented to person, place, and time.     Cranial Nerves: No cranial nerve deficit.  Psychiatric:        Mood and Affect: Mood normal.        Behavior: Behavior normal.      Assessment and Plan   1. Hypothyroidism, unspecified type - levothyroxine (SYNTHROID) 50 MCG tablet; 1 daily  Dispense: 90 tablet; Refill: 2  2. Need for vaccination - Flu Vaccine QUAD 36+ mos IM  3. Allergic rhinitis, unspecified seasonality, unspecified trigger - montelukast (SINGULAIR) 10 MG tablet; 1 daily  Dispense: 90 tablet; Refill: 2  4. Chronic insomnia   Pt requesting refills on Singulair and levothyroxine.  Hypothyroidism- TSH- normal.  Will cont meds.  Insomnia- stable, not worsened and pt stopped the trazodone.  Allergic rhinitis- con with Singulair and zyrtec.   F/u 68mo for well woman exam.

## 2020-05-08 ENCOUNTER — Other Ambulatory Visit: Payer: Self-pay

## 2020-05-08 ENCOUNTER — Telehealth (INDEPENDENT_AMBULATORY_CARE_PROVIDER_SITE_OTHER): Payer: PRIVATE HEALTH INSURANCE | Admitting: Family Medicine

## 2020-05-08 ENCOUNTER — Encounter: Payer: Self-pay | Admitting: Family Medicine

## 2020-05-08 DIAGNOSIS — J019 Acute sinusitis, unspecified: Secondary | ICD-10-CM | POA: Diagnosis not present

## 2020-05-08 DIAGNOSIS — B9689 Other specified bacterial agents as the cause of diseases classified elsewhere: Secondary | ICD-10-CM | POA: Diagnosis not present

## 2020-05-08 HISTORY — DX: Other specified bacterial agents as the cause of diseases classified elsewhere: B96.89

## 2020-05-08 MED ORDER — AMOXICILLIN 500 MG PO CAPS: 500.0000 mg | ORAL_CAPSULE | Freq: Three times a day (TID) | ORAL | 0 refills | Status: DC

## 2020-05-08 NOTE — Progress Notes (Signed)
Patient ID: Katie Ingram, female    DOB: 02-01-1956, 64 y.o.   MRN: [id number]   Chief Complaint  Patient presents with  . Sinusitis   Subjective:  CC: "my usual fall sinus infection"   Presents today via telephone visit with a complaint of "my usual fall sinus infection "reports that she has allergies to dust and mold, and has a cough related to postnasal drainage.  Symptoms started 2 weeks ago.  Has tried allergy medications x2 and Flonase.  Had a rapid Covid test which was negative.  Associated symptoms include headache, congestion, cough, and sinus pain and pressure on her face.  Pertinent negatives include no fever, no chills, no chest pain, no shortness of breath.   cough, sinus pressure over eyes and cheek bones and congestion for 2 weeks. Tried otc meds. Pt states her and her husband both took rapid covid test and both were negative. Allergies to mold and dust. Symptoms started 2 weeks ago.   Virtual Visit via Telephone Note  I connected with Katie Ingram on 05/08/20 at  2:30 PM EST by telephone and verified that I am speaking with the correct person using two identifiers.  Location: Patient: home Provider: office   I discussed the limitations, risks, security and privacy concerns of performing an evaluation and management service by telephone and the availability of in person appointments. I also discussed with the patient that there may be a patient responsible charge related to this service. The patient expressed understanding and agreed to proceed.   History of Present Illness:    Observations/Objective:   Assessment and Plan:   Follow Up Instructions:    I discussed the assessment and treatment plan with the patient. The patient was provided an opportunity to ask questions and all were answered. The patient agreed with the plan and demonstrated an understanding of the instructions.   The patient was advised to call back or seek an in-person evaluation if the  symptoms worsen or if the condition fails to improve as anticipated.  I provided 5 minutes of non-face-to-face time during this encounter.       Medical History Vernie has a past medical history of Allergic rhinitis, H/O goiter, Hypothyroidism, and Seasonal allergies.   Outpatient Encounter Medications as of 05/08/2020  Medication Sig  . Bacillus Coagulans-Inulin (ALIGN PREBIOTIC-PROBIOTIC PO) Take 1 capsule by mouth daily. One daily   . cetirizine (ZYRTEC) 10 MG tablet Take 10 mg by mouth daily.  Marland Kitchen ELDERBERRY PO Take 1 each by mouth daily.  . fish oil-omega-3 fatty acids 1000 MG capsule Take 2 g by mouth daily.  . fluticasone (FLONASE) 50 MCG/ACT nasal spray USE 2 SPRAYS IN EACH       NOSTRIL DAILY AS NEEDED (Patient taking differently: Place 2 sprays into both nostrils daily as needed for allergies. )  . Glucosamine-Chondroitin (MOVE FREE PO) Take 1 tablet by mouth daily.  Marland Kitchen levothyroxine (SYNTHROID) 50 MCG tablet 1 daily  . montelukast (SINGULAIR) 10 MG tablet 1 daily  . Multiple Vitamin (MULTIVITAMIN) tablet Take 1 tablet by mouth daily.  Vladimir Faster Glycol-Propyl Glycol (SYSTANE ULTRA) 0.4-0.3 % SOLN Place 1 drop into both eyes 2 (two) times a day.  . psyllium (METAMUCIL) 58.6 % powder Take 1 packet by mouth daily.  Marland Kitchen amoxicillin (AMOXIL) 500 MG capsule Take 1 capsule (500 mg total) by mouth 3 (three) times daily.   No facility-administered encounter medications on file as of 05/08/2020.     Review of Systems  Constitutional:  Negative for chills and fever.  HENT: Positive for congestion, postnasal drip, sinus pressure and sinus pain.   Respiratory: Positive for cough. Negative for shortness of breath.   Cardiovascular: Negative for chest pain.  Gastrointestinal: Negative for abdominal pain.  Neurological: Positive for headaches.     Vitals Unable due to phone visit  Objective:   Physical Exam Reports sinus pain and pressure in maxillary and frontal sinuses.    Assessment and Plan   1. Acute bacterial rhinosinusitis - amoxicillin (AMOXIL) 500 MG capsule; Take 1 capsule (500 mg total) by mouth 3 (three) times daily.  Dispense: 30 capsule; Refill: 0   She reports sinus pain and pressure and has history of sinus infection. Will treat.  Encouraged her to start taking OTC decongestant as long as her blood pressure is well controlled as she is wondering how to get rid of her nasal congestion. She is already taking allergy medication and Flonase for her symptoms.   Agrees with plan of care discussed today. Understands warning signs to seek further care: chest pain, shortness of breath, any changes in health. Understands to follow-up if symptoms do not improve or worsen.    Pecolia Ades, FNP-C 05/08/2020

## 2020-10-03 ENCOUNTER — Telehealth: Payer: Self-pay | Admitting: Family Medicine

## 2020-10-03 NOTE — Telephone Encounter (Signed)
Last labs 10/2019: Lipid, TSH, Liver, Met 7 and CBC

## 2020-10-03 NOTE — Telephone Encounter (Signed)
Patient has physical 6/3 and needing labs done

## 2020-10-09 ENCOUNTER — Telehealth: Payer: Self-pay

## 2020-10-09 ENCOUNTER — Other Ambulatory Visit: Payer: Self-pay | Admitting: *Deleted

## 2020-10-09 DIAGNOSIS — E039 Hypothyroidism, unspecified: Secondary | ICD-10-CM

## 2020-10-09 DIAGNOSIS — Z0000 Encounter for general adult medical examination without abnormal findings: Secondary | ICD-10-CM

## 2020-10-09 NOTE — Telephone Encounter (Signed)
Pt returned call and front staff informed that lab orders were placed to [facility]

## 2020-10-09 NOTE — Telephone Encounter (Signed)
Last labs 10/26/19 tsh, lipid, liver, bmp, cbc. Pt wants at [facility]

## 2020-10-09 NOTE — Telephone Encounter (Signed)
Orders put in for [facility]. Left message to return call to let pt know

## 2020-10-09 NOTE — Telephone Encounter (Signed)
Pt needs blood work before [facility] [employer] and she wants to know which blood work needs to be done? Her insurance will only go through [facility]    Pt call back [phone]

## 2020-10-09 NOTE — Telephone Encounter (Signed)
Ok yes, pls order. Thx. Dr. Darene Lamer

## 2020-10-11 NOTE — Telephone Encounter (Signed)
Orders in system

## 2020-11-09 ENCOUNTER — Other Ambulatory Visit: Payer: Self-pay

## 2020-11-09 ENCOUNTER — Encounter: Payer: Self-pay | Admitting: Nurse Practitioner

## 2020-11-09 ENCOUNTER — Ambulatory Visit (INDEPENDENT_AMBULATORY_CARE_PROVIDER_SITE_OTHER): Payer: PRIVATE HEALTH INSURANCE | Admitting: Nurse Practitioner

## 2020-11-09 VITALS — BP 122/88 | HR 78 | Temp 97.0°F | Ht 65.5 in | Wt 149.2 lb

## 2020-11-09 DIAGNOSIS — E2839 Other primary ovarian failure: Secondary | ICD-10-CM

## 2020-11-09 DIAGNOSIS — E039 Hypothyroidism, unspecified: Secondary | ICD-10-CM | POA: Diagnosis not present

## 2020-11-09 DIAGNOSIS — Z01419 Encounter for gynecological examination (general) (routine) without abnormal findings: Secondary | ICD-10-CM

## 2020-11-09 NOTE — Progress Notes (Signed)
Subjective:    Patient ID: Katie Ingram, female    DOB: 1956/04/10, 65 y.o.   MRN: [id number]  HPI The patient comes in today for a wellness visit.    A review of their health history was completed.  A review of medications was also completed.  Any needed refills;   Eating habits: healthy eating; has worked on weight loss following a low sugar diet.  Falls/  MVA accidents in past few months: none  Regular exercise: 10000 steps most days   Specialist pt sees on regular basis: none  Preventative health issues were discussed.   Married, same sexual partner.  No vaginal bleeding or pelvic pain.  Regular vision and dental exams. Had COVID about 3 weeks ago. Doing fine now. Taking her regular medicines for allergies.    Review of Systems  Constitutional: Negative for activity change and appetite change.  Respiratory: Negative for cough, chest tightness, shortness of breath and wheezing.   Cardiovascular: Negative for chest pain.  Gastrointestinal: Negative for abdominal distention, abdominal pain, blood in stool, constipation, diarrhea, nausea and vomiting.  Genitourinary: Negative for difficulty urinating, dyspareunia, dysuria, enuresis, frequency, genital sores, pelvic pain, urgency, vaginal bleeding and vaginal discharge.   Depression screen [facility] 2/9 11/09/2020 11/04/2019 07/23/2018 06/04/2017  Decreased Interest 0 0 0 0  Down, Depressed, Hopeless 0 0 0 0  PHQ - 2 Score 0 0 0 0  Altered sleeping - - - 1  Tired, decreased energy - - - 1  Change in appetite - - - 1  Feeling bad or failure about yourself  - - - 0  Trouble concentrating - - - 0  Moving slowly or fidgety/restless - - - 0  Suicidal thoughts - - - 0  PHQ-9 Score - - - 3        Objective:   Physical Exam Constitutional:      General: She is not in acute distress.    Appearance: She is well-developed.  Neck:     Thyroid: No thyromegaly.     Trachea: No tracheal deviation.     Comments: Thyroid nontender to  palpation, no mass or goiter noted. Cardiovascular:     Rate and Rhythm: Normal rate and regular rhythm.     Heart sounds: Normal heart sounds. No murmur heard.   Pulmonary:     Effort: Pulmonary effort is normal.     Breath sounds: Normal breath sounds.  Chest:  Breasts:     Right: No swelling, inverted nipple, mass, skin change, tenderness, axillary adenopathy or supraclavicular adenopathy.     Left: No swelling, inverted nipple, mass, skin change, tenderness, axillary adenopathy or supraclavicular adenopathy.    Abdominal:     General: Abdomen is flat. There is no distension.     Palpations: Abdomen is soft. There is no mass.     Tenderness: There is no abdominal tenderness.  Genitourinary:    Vagina: Normal. No vaginal discharge.     Comments: External GU atrophic changes noted, no rash or lesions.  Vagina slightly pale and dry, no discharge.  Cervix normal limit in appearance.  No CMT.  Bimanual exam no tenderness or obvious masses. Musculoskeletal:     Cervical back: Normal range of motion and neck supple.  Lymphadenopathy:     Cervical: No cervical adenopathy.     Upper Body:     Right upper body: No supraclavicular, axillary or pectoral adenopathy.     Left upper body: No supraclavicular, axillary or pectoral adenopathy.  Skin:    General: Skin is warm and dry.     Findings: No rash.  Neurological:     Mental Status: She is alert and oriented to person, place, and time.  Psychiatric:        Mood and Affect: Mood normal.        Behavior: Behavior normal.        Thought Content: Thought content normal.        Judgment: Judgment normal.    Today's Vitals   11/09/20 0831  BP: 122/88  Pulse: 78  Temp: (!) 97 F (36.1 C)  SpO2: 99%  Weight: 149 lb 3.2 oz (67.7 kg)  Height: 5' 5.5" (1.664 m)   Body mass index is 24.45 kg/m.;  See scanned labs dated 10/24/2020.  AST normal at 17.  ALT slightly elevated at 46.  TSH 1.5.  Lipid panel much improved from previous labs  with total cholesterol 168, HDL 55, triglycerides 65 and LDL 98. Note that she had COVID around the time of her labs.      Assessment & Plan:   Problem List Items Addressed This Visit      Endocrine   Hypothyroidism   Relevant Medications   levothyroxine (SYNTHROID) 50 MCG tablet     Other   Hypoestrogenism    Other Visit Diagnoses    Well woman exam    -  Primary     Meds ordered this encounter  Medications  . levothyroxine (SYNTHROID) 50 MCG tablet    Sig: 1 daily    Dispense:  90 tablet    Refill:  3    Order Specific Question:   Supervising Provider    Answer:   Sallee Lange A [[id number]]   Continue levothyroxine at current dose. Continue healthy habits.  Reviewed labs with patient during visit. Plans to schedule her own mammogram. Defers pneumonia vaccine. Return in about 1 year (around 11/09/2021) for physical.

## 2020-11-09 NOTE — Patient Instructions (Signed)
Luvena for vaginal dryness

## 2020-11-10 ENCOUNTER — Encounter: Payer: Self-pay | Admitting: Nurse Practitioner

## 2020-11-10 DIAGNOSIS — E2839 Other primary ovarian failure: Secondary | ICD-10-CM | POA: Insufficient documentation

## 2020-11-10 MED ORDER — LEVOTHYROXINE SODIUM 50 MCG PO TABS: ORAL_TABLET | ORAL | 3 refills | Status: DC

## 2020-11-19 ENCOUNTER — Other Ambulatory Visit (HOSPITAL_COMMUNITY): Payer: Self-pay | Admitting: Nurse Practitioner

## 2020-11-19 DIAGNOSIS — Z1231 Encounter for screening mammogram for malignant neoplasm of breast: Secondary | ICD-10-CM

## 2020-11-21 ENCOUNTER — Ambulatory Visit (HOSPITAL_COMMUNITY)
Admission: RE | Admit: 2020-11-21 | Discharge: 2020-11-21 | Disposition: A | Discharge status: Home / Self Care | Payer: Self-pay | Source: Ambulatory Visit | Attending: Nurse Practitioner | Admitting: Nurse Practitioner

## 2020-11-21 DIAGNOSIS — Z1231 Encounter for screening mammogram for malignant neoplasm of breast: Secondary | ICD-10-CM | POA: Insufficient documentation

## 2021-03-06 ENCOUNTER — Other Ambulatory Visit: Payer: Self-pay | Admitting: Family Medicine

## 2021-03-06 DIAGNOSIS — J309 Allergic rhinitis, unspecified: Secondary | ICD-10-CM

## 2021-06-07 ENCOUNTER — Other Ambulatory Visit: Payer: Self-pay | Admitting: Nurse Practitioner

## 2021-06-07 DIAGNOSIS — J309 Allergic rhinitis, unspecified: Secondary | ICD-10-CM

## 2021-09-17 ENCOUNTER — Other Ambulatory Visit: Payer: Self-pay | Admitting: Nurse Practitioner

## 2021-09-17 ENCOUNTER — Telehealth: Payer: Self-pay | Admitting: Nurse Practitioner

## 2021-09-17 DIAGNOSIS — Z131 Encounter for screening for diabetes mellitus: Secondary | ICD-10-CM

## 2021-09-17 DIAGNOSIS — Z13220 Encounter for screening for lipoid disorders: Secondary | ICD-10-CM

## 2021-09-17 DIAGNOSIS — E039 Hypothyroidism, unspecified: Secondary | ICD-10-CM

## 2021-09-17 DIAGNOSIS — J309 Allergic rhinitis, unspecified: Secondary | ICD-10-CM

## 2021-09-17 DIAGNOSIS — Z130 Encounter for screening for diseases of the blood and blood-forming organs and certain disorders involving the immune mechanism: Secondary | ICD-10-CM

## 2021-09-17 NOTE — Telephone Encounter (Signed)
Patient has physical in June and needing labs done. ?

## 2021-09-17 NOTE — Telephone Encounter (Signed)
Last labs 10/2020: Liver, CBC, Met 7 ?

## 2021-09-23 NOTE — Telephone Encounter (Signed)
Blood work ordered in [facility]. Left message to return call 

## 2021-09-23 NOTE — Telephone Encounter (Signed)
Pearson Forster C, NP   ? ?CBC, CMP, Lipid and TSH please. Thanks.   ? ?

## 2021-09-24 NOTE — Telephone Encounter (Signed)
Pt returned call and verbalized understanding  

## 2021-10-25 ENCOUNTER — Telehealth: Payer: Self-pay | Admitting: Family Medicine

## 2021-10-25 DIAGNOSIS — E039 Hypothyroidism, unspecified: Secondary | ICD-10-CM

## 2021-10-25 DIAGNOSIS — Z13220 Encounter for screening for lipoid disorders: Secondary | ICD-10-CM

## 2021-10-25 DIAGNOSIS — Z79899 Other long term (current) drug therapy: Secondary | ICD-10-CM

## 2021-10-25 DIAGNOSIS — Z0000 Encounter for general adult medical examination without abnormal findings: Secondary | ICD-10-CM

## 2021-10-25 NOTE — Addendum Note (Signed)
Addended by: Vicente Males on: 10/25/2021 01:47 PM   Modules accepted: Orders

## 2021-10-25 NOTE — Telephone Encounter (Signed)
Labs ordered through [facility]-pt is aware

## 2021-10-25 NOTE — Telephone Encounter (Signed)
Pt has physical scheduled for June and is needing to know what lab orders to order through [place] [facility]. Pt has active labs from April TSH, Lipid,CMP and CBC. Please advise. Thank you

## 2021-11-15 ENCOUNTER — Ambulatory Visit (INDEPENDENT_AMBULATORY_CARE_PROVIDER_SITE_OTHER): Payer: PRIVATE HEALTH INSURANCE | Admitting: Nurse Practitioner

## 2021-11-15 VITALS — BP 130/72 | HR 83 | Temp 97.0°F | Ht 66.0 in | Wt 156.6 lb

## 2021-11-15 DIAGNOSIS — Z01419 Encounter for gynecological examination (general) (routine) without abnormal findings: Secondary | ICD-10-CM | POA: Diagnosis not present

## 2021-11-15 DIAGNOSIS — Z79899 Other long term (current) drug therapy: Secondary | ICD-10-CM | POA: Diagnosis not present

## 2021-11-15 DIAGNOSIS — E7849 Other hyperlipidemia: Secondary | ICD-10-CM

## 2021-11-15 MED ORDER — ROSUVASTATIN CALCIUM 5 MG PO TABS: ORAL_TABLET | ORAL | 0 refills | Status: DC

## 2021-11-15 NOTE — Progress Notes (Unsigned)
Subjective:    Patient ID: Katie Ingram, female    DOB: 02/25/1956, 66 y.o.   MRN: [id number]  HPI  .The patient comes in today for a wellness visit.    A review of their health history was completed.  A review of medications was also completed.  Any needed refills; Yes  Eating habits: Good; has cut out sugars  Falls/  MVA accidents in past few months: No  Regular exercise: 10,000 steps a day; has recurrent foot problems which limited her for a time but this is better  Specialist pt sees on regular basis: No  Preventative health issues were discussed.   Additional concerns: Chronic dry eyes and mouth. Has been told this is due to Zyrtec and Singulair but her mother has the same issues.   Married, same sexual female partner. No vaginal bleeding.  Takes daily MVI. Plans to schedule her own mammogram. Regular dental and eye exams.   Review of Systems  Constitutional:  Negative for activity change, appetite change and fatigue.  HENT:  Negative for sore throat and trouble swallowing.        Chronic dry mouth.   Eyes:        Has chronic dry eyes which she has discussed with her optometrist. Eye exam normal.   Respiratory:  Negative for cough, chest tightness, shortness of breath and wheezing.   Cardiovascular:  Negative for chest pain.  Gastrointestinal:  Negative for abdominal distention, abdominal pain, constipation, diarrhea, nausea and vomiting.  Genitourinary:  Negative for difficulty urinating, dysuria, enuresis, frequency, genital sores, pelvic pain, urgency, vaginal bleeding and vaginal discharge.      11/15/2021    8:14 AM  Depression screen PHQ 2/9  Decreased Interest 0  Down, Depressed, Hopeless 0  PHQ - 2 Score 0        Objective:   Physical Exam Constitutional:      General: She is not in acute distress.    Appearance: She is well-developed.  Eyes:     Comments: Mild scleral injection in both eyes.   Neck:     Thyroid: No thyromegaly.     Trachea: No  tracheal deviation.     Comments: Thyroid non tender to palpation. No mass or goiter noted.  Cardiovascular:     Rate and Rhythm: Normal rate and regular rhythm.     Heart sounds: Normal heart sounds. No murmur heard. Pulmonary:     Effort: Pulmonary effort is normal.     Breath sounds: Normal breath sounds.  Chest:  Breasts:    Right: No swelling, inverted nipple, mass, skin change or tenderness.     Left: No swelling, inverted nipple, mass, skin change or tenderness.  Abdominal:     General: There is no distension.     Palpations: Abdomen is soft.     Tenderness: There is no abdominal tenderness.  Genitourinary:    Comments: External GU: signs of hypoestrogenism. No rashes or lesions. Vagina: pink no discharge. Cervix normal in appearance. Bimanual exam: no tenderness or obvious masses.  Musculoskeletal:     Cervical back: Normal range of motion and neck supple.  Lymphadenopathy:     Cervical: No cervical adenopathy.     Upper Body:     Right upper body: No supraclavicular, axillary or pectoral adenopathy.     Left upper body: No supraclavicular, axillary or pectoral adenopathy.  Skin:    General: Skin is warm and dry.     Findings: No rash.  Neurological:  Mental Status: She is alert and oriented to person, place, and time.  Psychiatric:        Mood and Affect: Mood normal.        Behavior: Behavior normal.        Thought Content: Thought content normal.        Judgment: Judgment normal.    Today's Vitals   11/15/21 0808 11/15/21 0908  BP: (!) 161/102 130/72  Pulse: 83   Temp: (!) 97 F (36.1 C)   TempSrc: Temporal   SpO2: 100%   Weight: 156 lb 9.6 oz (71 kg)   Height: '5\' 6"'$  (1.676 m)    Body mass index is 25.28 kg/m.   The 10-year ASCVD risk score (Arnett DK, et al., 2019) is: 7.7%   Values used to calculate the score:     Age: 36 years     Sex: Female     Is Non-Hispanic African American: No     Diabetic: No     Tobacco smoker: No     Systolic Blood  Pressure: 161 mmHg     Is BP treated: No     HDL Cholesterol: 68 mg/dL     Total Cholesterol: 234 mg/dL Based on previous labs but consistent with labs she has brought to the office today (see scanned lab report).       Assessment & Plan:   Problem List Items Addressed This Visit       Other   Other hyperlipidemia   Relevant Medications   rosuvastatin (CRESTOR) 5 MG tablet   Other Relevant Orders   Hepatic function panel   Lipid Profile   Other Visit Diagnoses     Well woman exam    -  Primary   Relevant Orders   Hepatic function panel   Lipid Profile   High risk medication use       Relevant Orders   Hepatic function panel   Lipid Profile      bd Meds ordered this encounter  Medications   rosuvastatin (CRESTOR) 5 MG tablet    Sig: Take one tab Mondays, Wednesdays, and Fridays    Dispense:  36 tablet    Refill:  0    Order Specific Question:   Supervising Provider    Answer:   Sallee Lange A [[id number]]   Encourage continued healthy lifestyle habits.  Based on her family history and ASCVD score, patient agrees to start low-dose rosuvastatin 3 days a week with repeat labs in 8 weeks. Based on several symptoms she is describing during her visit today, encourage patient to research Sjogren's syndrome and if she feels this is an issue, will refer her to a specialist for evaluation. Patient wishes to wait on her Prevnar 20 and bone density next year when she goes on Medicare. She plans to schedule her own mammogram. Return in about 1 year (around 11/16/2022) for physical.

## 2021-11-15 NOTE — Patient Instructions (Addendum)
Prevnar 20 at next visit Bone density next year Schedule mammogram  Sjogren's Syndrome Sjgren's syndrome is an inflammatory disease in which the body's disease-fighting system (immune system) attacks the glands that produce tears (lacrimal glands) and the glands that produce saliva (salivary glands). This makes the eyes and mouth very dry. Sjgren's syndrome can also affect other parts of the body, causing dryness of the skin, nose, throat, and vagina. Sjgren's syndrome is a long-term (chronic) disorder that has no cure. In some cases, it is linked to other disorders (rheumatic disorders), such as rheumatoid arthritis and systemic lupus erythematosus (SLE). It may affect other parts of the body, such as the: Blood vessels. Joints. Lungs. Kidneys. Liver or pancreas. Brain, nerves, or spinal cord. What are the causes? The cause of this condition is not known. It may be passed along from parent to child (inherited), or it may be a symptom of a rheumatic disorder. What increases the risk? This condition is more likely to develop in: Women. People who are 61-55 years old and older. People who have recently had a viral infection or currently have a viral infection. What are the signs or symptoms? The main symptoms of this condition are: Dry mouth. This may include: A chalky feeling. Difficulty swallowing, speaking, or tasting. Frequent cavities in the teeth. Frequent mouth infections. Dry eyes. This may include: Burning, redness, and itching. Blurry vision. Fluctuating vision. Light sensitivity. Other symptoms may include: Dryness of the skin and the inside of the nose. Eyelid infections. Vaginal dryness (if applicable). Joint pain and stiffness. Muscle pain and stiffness. How is this diagnosed? This condition is diagnosed based on: Your symptoms. Your medical history. A physical exam of your eyes and mouth. Tests, including: A Schirmer test. This tests your tear  production. An eye exam that is done with a magnifying device (slit-lamp exam). An eye test that temporarily stains your eye with special dyes. This shows the extent of eye damage. Tests to check your salivary gland function. Biopsy. This is a removal of part of a salivary gland from inside your lower lip to be studied under a microscope. Chest X-rays. Blood or urine tests. How is this treated? There is no cure for this condition, but treatment can help you manage your symptoms. You may be asked to see a rheumatologist for further evaluation and treatment. This condition may be treated with: Medicines to help relieve pain and stiffness. Medicines to help relieve inflammation in your body (corticosteroids). These are usually for severe cases. Medicines to help reduce the activity of your immune system (immunosuppressants). These are usually prescribed by your health care provider or a rheumatologist. Moisture replacement therapies to help relieve dryness in your skin, mouth, and eyes. Dry eyes may be treated with: Eye drops or nasal sprays to improve dryness of the eyes. Surgery or insertion of plugs to close the lacrimal glands (punctal occlusion). This helps keep more natural tears in your eyes. Soft contact lenses or hard scleral lenses. These are occasionally used to protect the surface of the eye. Biologic lubricating eye drops (serum tears). These are eye drops made from a person's own blood. They are used in some people with severe dry eye. Follow these instructions at home: Eye care  Use eye drops and other medicines as told by your health care provider. Protect your eyes from the sun and wind with sunglasses or glasses. Blink at least 5-6 times a minute. Maintain properly humidified air. You may want to use a humidifier at home  and at work. Avoid smoke. Mouth care Brush your teeth and floss after every meal. Chew sugar-free gum or suck on hard candy. This may help to relieve dry  mouth. Use antimicrobial mouthwash daily. Take frequent sips of water or sugar-free drinks. Use saliva substitutes or lip balm as told by your health care provider. See your dentist every 6 months. General instructions  Take over-the-counter and prescription medicines only as told by your health care provider. Drink enough fluid to keep your urine pale yellow. Keep all follow-up visits. This is important. Contact a health care provider if: You have a fever. You have night sweats. You are always tired. You have unexplained weight loss. You develop itchy skin. You have red patches on your skin. You have a lump or swelling on your neck. Get help right away if: You develop severe eye pain. You develop sudden decreased vision. Summary Sjgren's syndrome is a disease in which the body's immune system attacks the glands that produce tears and the glands that produce saliva. This condition makes the eyes and mouth very dry. Sjgren's syndrome is a long-term (chronic) disorder. There is no cure for this condition, but treatment can help you manage your symptoms. The cause of this condition is not known. You may be asked to see a rheumatologist for further evaluation and treatment. This information is not intended to replace advice given to you by your health care provider. Make sure you discuss any questions you have with your health care provider. Document Revised: 12/24/2020 Document Reviewed: 12/24/2020 Elsevier Patient Education  [place].

## 2021-11-16 ENCOUNTER — Encounter: Payer: Self-pay | Admitting: Nurse Practitioner

## 2021-11-16 DIAGNOSIS — E7849 Other hyperlipidemia: Secondary | ICD-10-CM | POA: Insufficient documentation

## 2021-11-21 ENCOUNTER — Other Ambulatory Visit (HOSPITAL_COMMUNITY): Payer: Self-pay | Admitting: Family Medicine

## 2021-11-21 ENCOUNTER — Other Ambulatory Visit (HOSPITAL_COMMUNITY): Payer: Self-pay | Admitting: Nurse Practitioner

## 2021-11-21 DIAGNOSIS — Z1231 Encounter for screening mammogram for malignant neoplasm of breast: Secondary | ICD-10-CM

## 2021-12-02 ENCOUNTER — Ambulatory Visit (HOSPITAL_COMMUNITY)
Admission: RE | Admit: 2021-12-02 | Discharge: 2021-12-02 | Disposition: A | Discharge status: Home / Self Care | Payer: No Typology Code available for payment source | Source: Ambulatory Visit | Attending: Nurse Practitioner | Admitting: Nurse Practitioner

## 2021-12-02 DIAGNOSIS — Z1231 Encounter for screening mammogram for malignant neoplasm of breast: Secondary | ICD-10-CM | POA: Diagnosis present

## 2021-12-09 ENCOUNTER — Other Ambulatory Visit: Payer: Self-pay | Admitting: Nurse Practitioner

## 2021-12-09 ENCOUNTER — Other Ambulatory Visit: Payer: Self-pay | Admitting: Family Medicine

## 2021-12-09 DIAGNOSIS — E039 Hypothyroidism, unspecified: Secondary | ICD-10-CM

## 2021-12-09 DIAGNOSIS — J309 Allergic rhinitis, unspecified: Secondary | ICD-10-CM

## 2022-01-21 ENCOUNTER — Encounter: Payer: Self-pay | Admitting: Emergency Medicine

## 2022-01-21 ENCOUNTER — Ambulatory Visit
Admission: EM | Admit: 2022-01-21 | Discharge: 2022-01-21 | Disposition: A | Discharge status: Home / Self Care | Payer: PRIVATE HEALTH INSURANCE | Attending: Nurse Practitioner | Admitting: Nurse Practitioner

## 2022-01-21 DIAGNOSIS — R21 Rash and other nonspecific skin eruption: Secondary | ICD-10-CM

## 2022-01-21 LAB — HEPATIC FUNCTION PANEL
ALT: 28 IU/L (ref 0–32)
AST: 19 IU/L (ref 0–40)
Albumin: 4.8 g/dL (ref 3.9–4.9)
Alkaline Phosphatase: 55 IU/L (ref 44–121)
Bilirubin Total: 0.6 mg/dL (ref 0.0–1.2)
Bilirubin, Direct: 0.15 mg/dL (ref 0.00–0.40)
Total Protein: 6.8 g/dL (ref 6.0–8.5)

## 2022-01-21 LAB — LIPID PANEL
Chol/HDL Ratio: 2.9 ratio (ref 0.0–4.4)
Cholesterol, Total: 174 mg/dL (ref 100–199)
HDL: 61 mg/dL (ref >39)
LDL Chol Calc (NIH): 88 mg/dL (ref 0–99)
Triglycerides: 143 mg/dL (ref 0–149)
VLDL Cholesterol Cal: 25 mg/dL (ref 5–40)

## 2022-01-21 MED ORDER — FAMOTIDINE 20 MG PO TABS: 20.0000 mg | ORAL_TABLET | Freq: Every day | ORAL | 0 refills | Status: DC

## 2022-01-21 MED ORDER — DEXAMETHASONE SODIUM PHOSPHATE 10 MG/ML IJ SOLN
10.0000 mg | INTRAMUSCULAR | Status: AC
  Administered 2022-01-21: 10 mg via INTRAMUSCULAR

## 2022-01-21 MED ORDER — PREDNISONE 50 MG PO TABS: ORAL_TABLET | ORAL | 0 refills | Status: DC

## 2022-01-21 NOTE — Discharge Instructions (Addendum)
Take medication as prescribed. Continue Zyrtec and your regular allergy regimen at this time.  May continue to take Benadryl at bedtime to help with itching. Avoid hot baths or showers while symptoms persist.  Recommend taking lukewarm baths. May apply cool cloths to the area to help with itching or discomfort. Avoid scratching, rubbing, or manipulating the areas while symptoms persist. Recommend Aveeno colloidal oatmeal bath to use to help with drying and itching. Go to the emergency department if you develop shortness of breath, difficulty breathing, tongue swelling throat swelling, or other concerns. Follow-up with your primary care physician if symptoms recur.

## 2022-01-21 NOTE — ED Provider Notes (Signed)
[facility]    CSN: [id number] Arrival date & time: 01/21/22  1308      History   Chief Complaint Chief Complaint  Patient presents with   Rash    HPI Katie Ingram is a 66 y.o. female.   The history is provided by the patient.   Patient presents for complaints of rash that started earlier today.  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  She states subsequently thereafter, she developed itching to her hands, feet, and legs.  Since that time she has developed a rash to her chest and back.  She states the rash is itchy.  She denies oozing or drainage.  She states that she normally uses CBD oil in her coffee and that she recently changed to a different brand.  She cannot think of any other possible triggers that may have caused her symptoms.  She denies lip swelling, tongue swelling, chest pain, wheezing, shortness of breath, difficulty breathing, or exposure to new soaps, foods, or medications.  Patient states she had the same or similar reaction approximately 3 days ago and was able to take Benadryl for resolution.  She states her symptoms today are much worse than the episode she had at that time.  Patient takes Zyrtec daily for allergies along with Singulair.  She states that she took Benadryl prior to her arrival today.  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   Acute bacterial rhinosinusitis 05/08/2020   Chronic insomnia 20/25/4270   Systolic murmur 62/37/6283   Rhinitis, allergic 01/18/2016   Hypothyroidism 11/08/2012    Past Surgical History:  Procedure Laterality Date   COLONOSCOPY     COLONOSCOPY N/A 01/05/2019   Procedure: COLONOSCOPY;  Surgeon: Daneil Dolin, MD;  Location: [facility];  Service: Endoscopy;  Laterality: N/A;  10:30   DILATION AND CURETTAGE OF UTERUS     ENDOMETRIAL ABLATION     ESOPHAGOGASTRODUODENOSCOPY     POLYPECTOMY  01/05/2019   Procedure: POLYPECTOMY;  Surgeon: Daneil Dolin, MD;  Location: [facility];  Service: Endoscopy;;  splenic flexurex2   THYROID SURGERY     TONSILLECTOMY AND ADENOIDECTOMY     TUBAL LIGATION      OB History   No obstetric history on file.      Home Medications    Prior to Admission medications   Medication Sig Start Date End Date Taking? Authorizing Provider  famotidine (PEPCID) 20 MG tablet Take 1 tablet (20 mg total) by mouth daily. 01/21/22 02/20/22 Yes Jadakiss Barish-Warren, Alda Lea, NP  predniSONE (DELTASONE) 50 MG tablet Take 1 tablet daily with breakfast for the next 5 days. 01/21/22  Yes Alba Perillo-Warren, Alda Lea, NP  Bacillus Coagulans-Inulin (ALIGN PREBIOTIC-PROBIOTIC PO) Take 1 capsule by mouth daily. One daily    [provider]  cetirizine (ZYRTEC) 10 MG tablet Take 10 mg by mouth daily.    [provider]  ELDERBERRY PO Take 1 each by mouth daily.    [provider]  fish oil-omega-3 fatty acids 1000 MG capsule Take 2 g by mouth daily.    [provider]  fluticasone (FLONASE) 50 MCG/ACT nasal spray USE 2 SPRAYS IN [facility]  NOSTRIL DAILY AS NEEDED Patient taking differently: Place 2 sprays into both nostrils 2 (two) times daily as needed for allergies. 10/01/15   Mikey Kirschner, MD  Glucosamine-Chondroitin (MOVE FREE PO) Take 1 tablet by mouth daily.    [provider]  levothyroxine (SYNTHROID) 50 MCG tablet TAKE (1) TABLET BY MOUTH ONCE DAILY. 12/09/21   Cook, Jayce G, DO  montelukast (SINGULAIR) 10 MG tablet TAKE (1) TABLET BY MOUTH ONCE DAILY. 12/09/21   Coral Spikes, DO  Multiple Vitamin (MULTIVITAMIN) tablet Take 1 tablet by mouth daily.    [provider]  Polyethyl Glycol-Propyl Glycol (SYSTANE ULTRA) 0.4-0.3 % SOLN Place 1 drop into both  eyes 2 (two) times a day.    [provider]  psyllium (METAMUCIL) 58.6 % powder Take 1 packet by mouth daily.    [provider]  rosuvastatin (CRESTOR) 5 MG tablet Take one tab Mondays, Wednesdays, and Fridays 11/15/21   Nilda Simmer, NP    Family History Family History  Problem Relation Age of Onset   Hypertension Mother    Arthritis Mother    Hypertension Father    Hyperlipidemia Father    Cancer Other        colon   Colon cancer Other    Arthritis Maternal Grandmother     Social History Social History   Tobacco Use   Smoking status: Never   Smokeless tobacco: Never  Vaping Use   Vaping Use: Never used  Substance Use Topics   Alcohol use: Yes    Alcohol/week: 0.0 standard drinks of alcohol    Comment: occas social   Drug use: No     Allergies   Bactrim [sulfamethoxazole-trimethoprim], Biaxin [clarithromycin], Ceftin [cefuroxime axetil], Compazine [prochlorperazine edisylate], Floxacillin [floxacillin (flucloxacillin)], Levaquin [levofloxacin in d5w], and Tobramycin   Review of Systems Review of Systems Per HPI  Physical Exam Triage Vital Signs ED Triage Vitals [01/21/22 1324]  Enc Vitals Group     BP 136/88     Pulse Rate 100     Resp 17     Temp 98.3 F (36.8 C)     Temp src      SpO2 98 %     Weight      Height      Head Circumference      Peak Flow      Pain Score 0     Pain Loc      Pain Edu?      Excl. in [place]?    No data found.  Updated Vital Signs BP 136/88   Pulse 100   Temp 98.3 F (36.8 C)   Resp 17   SpO2 98%   Visual Acuity Right Eye Distance:   Left Eye Distance:   Bilateral Distance:    Right Eye Near:   Left Eye Near:    Bilateral Near:     Physical Exam Vitals and nursing note reviewed.  Constitutional:      General: She is not in acute distress.    Appearance: Normal appearance.  HENT:     Head: Normocephalic.     Nose: Nose normal.     Mouth/Throat:     Mouth: Mucous membranes are  moist.  Eyes:     Extraocular Movements: Extraocular movements intact.     Conjunctiva/sclera: Conjunctivae normal.     Pupils: Pupils are equal, round, and reactive to light.  Cardiovascular:     Rate and Rhythm: Normal rate and regular rhythm.  Pulses: Normal pulses.     Heart sounds: Normal heart sounds.  Pulmonary:     Effort: Pulmonary effort is normal.     Breath sounds: Normal breath sounds.  Abdominal:     General: Bowel sounds are normal.     Palpations: Abdomen is soft.  Musculoskeletal:     Cervical back: Normal range of motion.  Lymphadenopathy:     Cervical: No cervical adenopathy.  Skin:    General: Skin is warm and dry.     Findings: Rash present. Rash is urticarial.     Comments: Urticarial rash located on chest under the bra line bilaterally, bilateral mid back and left abdomen.  Erythema noted to bilateral lower extremities.  Neurological:     General: No focal deficit present.     Mental Status: She is alert and oriented to person, place, and time.  Psychiatric:        Mood and Affect: Mood normal.        Behavior: Behavior normal.      UC Treatments / Results  Labs (all labs ordered are listed, but only abnormal results are displayed) Labs Reviewed - No data to display  EKG   Radiology No results found.  Procedures Procedures (including critical care time)  Medications Ordered in UC Medications  dexamethasone (DECADRON) injection 10 mg (10 mg Intramuscular Given 01/21/22 1338)    Initial Impression / Assessment and Plan / UC Course  I have reviewed the triage vital signs and the nursing notes.  Pertinent labs & imaging results that were available during my care of the patient were reviewed by me and considered in my medical decision making (see chart for details).  Patient presents with rash that presented earlier today.  Patient does not recall any known trigger.  Rash presented approximately 2 to 3 days ago that resolved with Benadryl.   On exam, patient has a urticarial rash to the bilateral bra line, bilateral mid back, and left abdomen.  She also has erythema to her lower extremities.  Discussion with patient as it is difficult to ascertain the cause of her symptoms.  Patient denies any new soaps, medications, detergents, but does endorse that she was in the greenhouse when her symptoms started today and that she recently used a new CBD oil.  Decadron 10 mg injection was given in the clinic.  Patient was prescribed prednisone 50 mg for 5 days along with Pepcid 20 mg daily.  Supportive recommendations were provided to the patient.  Differential diagnoses include contact dermatitis, urticaria, and eczema.  Patient was advised to follow-up with her primary care physician for recurrence or if symptoms do not improve. Final Clinical Impressions(s) / UC Diagnoses   Final diagnoses:  Rash and nonspecific skin eruption     Discharge Instructions      Take medication as prescribed. Continue Zyrtec and your regular allergy regimen at this time.  May continue to take Benadryl at bedtime to help with itching. Avoid hot baths or showers while symptoms persist.  Recommend taking lukewarm baths. May apply cool cloths to the area to help with itching or discomfort. Avoid scratching, rubbing, or manipulating the areas while symptoms persist. Recommend Aveeno colloidal oatmeal bath to use to help with drying and itching. Go to the emergency department if you develop shortness of breath, difficulty breathing, tongue swelling throat swelling, or other concerns. Follow-up with your primary care physician if symptoms recur.    ED Prescriptions     Medication Sig Dispense Auth.  Provider   predniSONE (DELTASONE) 50 MG tablet Take 1 tablet daily with breakfast for the next 5 days. 5 tablet Lollie Gunner-Warren, Alda Lea, NP   famotidine (PEPCID) 20 MG tablet Take 1 tablet (20 mg total) by mouth daily. 30 tablet Aleayah Chico-Warren, Alda Lea, NP       PDMP not reviewed this encounter.   Tish Men, NP 01/21/22 1352

## 2022-01-21 NOTE — ED Triage Notes (Signed)
Pt is present today with concerns for a rash on her abdomen, legs, and feet. Pt states that she noticed the rash Friday. Pt states that she took benadryl Friday and the rash cleared up but returned this morning. Pt states that she took a benadryl 20 mins ago.

## 2022-01-24 ENCOUNTER — Other Ambulatory Visit: Payer: Self-pay | Admitting: Nurse Practitioner

## 2022-01-27 MED ORDER — ROSUVASTATIN CALCIUM 5 MG PO TABS: ORAL_TABLET | ORAL | 1 refills | Status: DC

## 2022-03-17 ENCOUNTER — Other Ambulatory Visit: Payer: Self-pay | Admitting: Family Medicine

## 2022-03-17 ENCOUNTER — Other Ambulatory Visit: Payer: Self-pay | Admitting: Nurse Practitioner

## 2022-03-17 DIAGNOSIS — J309 Allergic rhinitis, unspecified: Secondary | ICD-10-CM

## 2022-03-17 DIAGNOSIS — E039 Hypothyroidism, unspecified: Secondary | ICD-10-CM

## 2022-06-11 ENCOUNTER — Other Ambulatory Visit: Payer: Self-pay | Admitting: Family Medicine

## 2022-06-11 DIAGNOSIS — E039 Hypothyroidism, unspecified: Secondary | ICD-10-CM

## 2022-06-11 DIAGNOSIS — J309 Allergic rhinitis, unspecified: Secondary | ICD-10-CM

## 2022-07-22 ENCOUNTER — Other Ambulatory Visit: Payer: Self-pay | Admitting: Family Medicine

## 2022-09-10 ENCOUNTER — Other Ambulatory Visit: Payer: Self-pay | Admitting: Family Medicine

## 2022-09-10 DIAGNOSIS — E039 Hypothyroidism, unspecified: Secondary | ICD-10-CM

## 2022-09-10 DIAGNOSIS — J309 Allergic rhinitis, unspecified: Secondary | ICD-10-CM

## 2022-10-06 ENCOUNTER — Telehealth: Payer: Self-pay

## 2022-10-06 DIAGNOSIS — E7849 Other hyperlipidemia: Secondary | ICD-10-CM

## 2022-10-06 DIAGNOSIS — E039 Hypothyroidism, unspecified: Secondary | ICD-10-CM

## 2022-10-06 DIAGNOSIS — Z79899 Other long term (current) drug therapy: Secondary | ICD-10-CM

## 2022-10-06 NOTE — Telephone Encounter (Signed)
Pt has a Phy with [name] on June the 10 th and needs her blood work ordered.   Katie [phone]

## 2022-10-13 ENCOUNTER — Other Ambulatory Visit: Payer: Self-pay | Admitting: Family Medicine

## 2022-10-13 DIAGNOSIS — E039 Hypothyroidism, unspecified: Secondary | ICD-10-CM

## 2022-10-13 DIAGNOSIS — J309 Allergic rhinitis, unspecified: Secondary | ICD-10-CM

## 2022-10-13 NOTE — Telephone Encounter (Signed)
Hoskins, Carolyn C, NP     CBC, CMP, Lipid and TSH. Thanks.   

## 2022-10-14 DIAGNOSIS — D225 Melanocytic nevi of trunk: Secondary | ICD-10-CM | POA: Diagnosis not present

## 2022-10-14 DIAGNOSIS — L638 Other alopecia areata: Secondary | ICD-10-CM | POA: Diagnosis not present

## 2022-10-14 DIAGNOSIS — Z1283 Encounter for screening for malignant neoplasm of skin: Secondary | ICD-10-CM | POA: Diagnosis not present

## 2022-10-14 DIAGNOSIS — D485 Neoplasm of uncertain behavior of skin: Secondary | ICD-10-CM | POA: Diagnosis not present

## 2022-10-14 DIAGNOSIS — C44519 Basal cell carcinoma of skin of other part of trunk: Secondary | ICD-10-CM | POA: Diagnosis not present

## 2022-10-15 NOTE — Telephone Encounter (Signed)
Blood work ordered in EPIC. Left message to return call  

## 2022-10-20 NOTE — Telephone Encounter (Signed)
Follow Up

## 2022-10-23 ENCOUNTER — Ambulatory Visit
Admission: RE | Admit: 2022-10-23 | Discharge: 2022-10-23 | Disposition: A | Discharge status: Home / Self Care | Payer: Medicare HMO | Source: Ambulatory Visit | Attending: Nurse Practitioner | Admitting: Nurse Practitioner

## 2022-10-23 VITALS — BP 142/85 | HR 104 | Temp 98.7°F | Resp 15

## 2022-10-23 DIAGNOSIS — Z1152 Encounter for screening for COVID-19: Secondary | ICD-10-CM | POA: Diagnosis not present

## 2022-10-23 DIAGNOSIS — J069 Acute upper respiratory infection, unspecified: Secondary | ICD-10-CM | POA: Insufficient documentation

## 2022-10-23 LAB — POCT INFLUENZA A/B
Influenza A, POC: NEGATIVE
Influenza B, POC: NEGATIVE

## 2022-10-23 MED ORDER — BENZONATATE 100 MG PO CAPS
100.0000 mg | ORAL_CAPSULE | Freq: Three times a day (TID) | ORAL | 0 refills | Status: DC | PRN
Start: 2022-10-23 — End: 2022-11-17

## 2022-10-23 NOTE — ED Provider Notes (Signed)
[facility]    CSN: [id number] Arrival date & time: 10/23/22  1752      History   Chief Complaint No chief complaint on file.   HPI Katie Ingram is a 66 y.o. female.   Patient presents today with sudden onset of flu like symptoms that started approximately 12 hours ago.  Reports Tmax 100.9 F, also endorses body aches and chills, dry cough, nasal congestion, sore throat, headache, nausea without vomiting, decreased appetite, and fatigue.  She denies congested cough, shortness of breath or chest pain, runny nose, ear pain, abdominal pain, vomiting, or diarrhea.  No known sick contacts, however she does watch her 58-month-old granddaughter who goes to daycare once per week.  Has been taking Tylenol and ibuprofen which has seemed to have helped.  Patient denies history of chronic lung disease.    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   Acute bacterial rhinosinusitis 05/08/2020   Chronic insomnia 11/04/2019   Systolic murmur 06/05/2017   Rhinitis, allergic 01/18/2016   Hypothyroidism 11/08/2012    Past Surgical History:  Procedure Laterality Date   COLONOSCOPY     COLONOSCOPY N/A 01/05/2019   Procedure: COLONOSCOPY;  Surgeon: Corbin Ade, MD;  Location: [facility];  Service: Endoscopy;  Laterality: N/A;  10:30   DILATION AND CURETTAGE OF UTERUS     ENDOMETRIAL ABLATION     ESOPHAGOGASTRODUODENOSCOPY     POLYPECTOMY  01/05/2019   Procedure: POLYPECTOMY;  Surgeon: Corbin Ade, MD;  Location: [facility];  Service: Endoscopy;;  splenic flexurex2   THYROID SURGERY     TONSILLECTOMY AND ADENOIDECTOMY     TUBAL LIGATION      OB History   No obstetric history on file.      Home Medications    Prior to Admission medications   Medication Sig Start Date End  Date Taking? Authorizing Provider  benzonatate (TESSALON) 100 MG capsule Take 1 capsule (100 mg total) by mouth 3 (three) times daily as needed for cough. Do not take with alcohol or while driving or operating heavy machinery.  May cause drowsiness. 10/23/22  Yes Cathlean Marseilles A, NP  Bacillus Coagulans-Inulin (ALIGN PREBIOTIC-PROBIOTIC PO) Take 1 capsule by mouth daily. One daily    [provider]  cetirizine (ZYRTEC) 10 MG tablet Take 10 mg by mouth daily.    [provider]  ELDERBERRY PO Take 1 each by mouth daily.    [provider]  famotidine (PEPCID) 20 MG tablet Take 1 tablet (20 mg total) by mouth daily. 01/21/22 02/20/22  Leath-Warren, Sadie Haber, NP  fish oil-omega-3 fatty acids 1000 MG capsule Take 2 g by mouth daily.    [provider]  fluticasone (FLONASE) 50 MCG/ACT nasal spray USE 2 SPRAYS IN EACH       NOSTRIL DAILY AS NEEDED Patient taking differently: Place 2 sprays into both nostrils 2 (two) times daily as needed for allergies. 10/01/15   Merlyn Albert, MD  Glucosamine-Chondroitin (MOVE FREE PO) Take 1 tablet by mouth daily.    [provider]  levothyroxine (SYNTHROID) 50 MCG tablet TAKE (1) TABLET BY MOUTH ONCE DAILY. 10/20/22   Cook, Jayce G, DO  montelukast (SINGULAIR) 10 MG tablet TAKE (1) TABLET BY MOUTH ONCE DAILY. 10/20/22  Everlene Other G, DO  Multiple Vitamin (MULTIVITAMIN) tablet Take 1 tablet by mouth daily.    [provider]  Polyethyl Glycol-Propyl Glycol (SYSTANE ULTRA) 0.4-0.3 % SOLN Place 1 drop into both eyes 2 (two) times a day.    [provider]  psyllium (METAMUCIL) 58.6 % powder Take 1 packet by mouth daily.    [provider]  rosuvastatin (CRESTOR) 5 MG tablet TAKE (1) TABLET BY MOUTH DAILY ON MONDAY, WEDNESDAY AND FRIDAY. 07/22/22   Tommie Sams, DO    Family History Family History  Problem Relation Age of Onset   Hypertension Mother    Arthritis Mother    Hypertension  Father    Hyperlipidemia Father    Cancer Other        colon   Colon cancer Other    Arthritis Maternal Grandmother     Social History Social History   Tobacco Use   Smoking status: Never   Smokeless tobacco: Never  Vaping Use   Vaping Use: Never used  Substance Use Topics   Alcohol use: Yes    Alcohol/week: 0.0 standard drinks of alcohol    Comment: occas social   Drug use: No     Allergies   Bactrim [sulfamethoxazole-trimethoprim], Biaxin [clarithromycin], Ceftin [cefuroxime axetil], Compazine [prochlorperazine edisylate], Floxacillin [floxacillin (flucloxacillin)], Levaquin [levofloxacin in d5w], and Tobramycin   Review of Systems Review of Systems Per HPI  Physical Exam Triage Vital Signs ED Triage Vitals [10/23/22 1756]  Enc Vitals Group     BP (!) 142/85     Pulse Rate (!) 104     Resp 15     Temp 98.7 F (37.1 C)     Temp Source Oral     SpO2 96 %     Weight      Height      Head Circumference      Peak Flow      Pain Score      Pain Loc      Pain Edu?      Excl. in GC?    No data found.  Updated Vital Signs BP (!) 142/85   Pulse (!) 104   Temp 98.7 F (37.1 C) (Oral)   Resp 15   SpO2 96%   Visual Acuity Right Eye Distance:   Left Eye Distance:   Bilateral Distance:    Right Eye Near:   Left Eye Near:    Bilateral Near:     Physical Exam Vitals and nursing note reviewed.  Constitutional:      General: She is not in acute distress.    Appearance: Normal appearance. She is not ill-appearing or toxic-appearing.  HENT:     Head: Normocephalic and atraumatic.     Right Ear: Tympanic membrane, ear canal and external ear normal.     Left Ear: Tympanic membrane, ear canal and external ear normal.     Nose: No congestion or rhinorrhea.     Mouth/Throat:     Mouth: Mucous membranes are moist.     Pharynx: Oropharynx is clear. Posterior oropharyngeal erythema present. No oropharyngeal exudate.  Eyes:     General: No scleral icterus.     Extraocular Movements: Extraocular movements intact.  Cardiovascular:     Rate and Rhythm: Normal rate and regular rhythm.  Pulmonary:     Effort: Pulmonary effort is normal. No respiratory distress.     Breath sounds: Normal breath sounds. No wheezing, rhonchi or rales.  Musculoskeletal:  Cervical back: Normal range of motion and neck supple.  Lymphadenopathy:     Cervical: No cervical adenopathy.  Skin:    General: Skin is warm and dry.     Coloration: Skin is not jaundiced or pale.     Findings: No erythema or rash.  Neurological:     Mental Status: She is alert and oriented to person, place, and time.  Psychiatric:        Behavior: Behavior is cooperative.      UC Treatments / Results  Labs (all labs ordered are listed, but only abnormal results are displayed) Labs Reviewed  SARS CORONAVIRUS 2 (TAT 6-24 HRS)  POCT INFLUENZA A/B    EKG   Radiology No results found.  Procedures Procedures (including critical care time)  Medications Ordered in UC Medications - No data to display  Initial Impression / Assessment and Plan / UC Course  I have reviewed the triage vital signs and the nursing notes.  Pertinent labs & imaging results that were available during my care of the patient were reviewed by me and considered in my medical decision making (see chart for details).   Patient is well-appearing, normotensive, afebrile, not tachypneic, oxygenating well on room air.  The patient is mildly tachycardic in triage today.  1. Viral URI with cough 2. Encounter for screening for COVID-19 Suspect viral etiology Vital signs and exam today reassuring Influenza test today is negative COVID-19 test is pending Supportive care discussed with patient ER and return precautions discussed  The patient was given the opportunity to ask questions.  All questions answered to their satisfaction.  The patient is in agreement to this plan.    Final Clinical Impressions(s) / UC  Diagnoses   Final diagnoses:  Viral URI with cough  Encounter for screening for COVID-19     Discharge Instructions      You have a viral upper respiratory infection.  Symptoms should improve over the next week to 10 days.  If you develop chest pain or shortness of breath, go to the emergency room.  We have tested you today for COVID-19.  You will see the results in Mychart and we will call you with positive results.  Please stay home and isolate until you are aware of the results.    Some things that can make you feel better are: - Increased rest - Increasing fluid with water/sugar free electrolytes - Acetaminophen and ibuprofen as needed for fever/pain - Salt water gargling, chloraseptic spray and throat lozenges - OTC guaifenesin (Mucinex) 600 mg twice daily for congestion - Saline sinus flushes or a neti pot - Humidifying the air -Tessalon Perles during the day as needed for dry cough     ED Prescriptions     Medication Sig Dispense Auth. Provider   benzonatate (TESSALON) 100 MG capsule Take 1 capsule (100 mg total) by mouth 3 (three) times daily as needed for cough. Do not take with alcohol or while driving or operating heavy machinery.  May cause drowsiness. 21 capsule Valentino Nose, NP      PDMP not reviewed this encounter.   Valentino Nose, NP 10/23/22 1921

## 2022-10-23 NOTE — Discharge Instructions (Signed)
You have a viral upper respiratory infection.  Symptoms should improve over the next week to 10 days.  If you develop chest pain or shortness of breath, go to the emergency room.  We have tested you today for COVID-19.  You will see the results in Mychart and we will call you with positive results.    Please stay home and isolate until you are aware of the results.    Some things that can make you feel better are: - Increased rest - Increasing fluid with water/sugar free electrolytes - Acetaminophen and ibuprofen as needed for fever/pain - Salt water gargling, chloraseptic spray and throat lozenges - OTC guaifenesin (Mucinex) 600 mg twice daily for congestion - Saline sinus flushes or a neti pot - Humidifying the air -Tessalon Perles during the day as needed for dry cough  

## 2022-10-23 NOTE — ED Triage Notes (Signed)
Pt c/o flu like sx's, headache, loss of appetite, chills, body ache, cough pt has only been able to keep down saltine and ginger ale.

## 2022-10-24 LAB — SARS CORONAVIRUS 2 (TAT 6-24 HRS): SARS Coronavirus 2: NEGATIVE

## 2022-10-29 DIAGNOSIS — D485 Neoplasm of uncertain behavior of skin: Secondary | ICD-10-CM | POA: Diagnosis not present

## 2022-10-29 DIAGNOSIS — D225 Melanocytic nevi of trunk: Secondary | ICD-10-CM | POA: Diagnosis not present

## 2022-10-31 ENCOUNTER — Encounter: Payer: Self-pay | Admitting: *Deleted

## 2022-10-31 NOTE — Telephone Encounter (Signed)
Unable to reach patient by phone. Patient notified via my chart

## 2022-11-10 DIAGNOSIS — E7849 Other hyperlipidemia: Secondary | ICD-10-CM | POA: Diagnosis not present

## 2022-11-10 DIAGNOSIS — Z79899 Other long term (current) drug therapy: Secondary | ICD-10-CM | POA: Diagnosis not present

## 2022-11-10 DIAGNOSIS — E039 Hypothyroidism, unspecified: Secondary | ICD-10-CM | POA: Diagnosis not present

## 2022-11-11 LAB — CBC WITH DIFFERENTIAL/PLATELET
Basophils Absolute: 0 10*3/uL (ref 0.0–0.2)
Basos: 1 %
EOS (ABSOLUTE): 0.1 10*3/uL (ref 0.0–0.4)
Eos: 2 %
Hematocrit: 43.4 % (ref 34.0–46.6)
Hemoglobin: 14.6 g/dL (ref 11.1–15.9)
Immature Grans (Abs): 0 10*3/uL (ref 0.0–0.1)
Immature Granulocytes: 0 %
Lymphocytes Absolute: 1.5 10*3/uL (ref 0.7–3.1)
Lymphs: 31 %
MCH: 31.6 pg (ref 26.6–33.0)
MCHC: 33.6 g/dL (ref 31.5–35.7)
MCV: 94 fL (ref 79–97)
Monocytes Absolute: 0.5 10*3/uL (ref 0.1–0.9)
Monocytes: 10 %
Neutrophils Absolute: 2.6 10*3/uL (ref 1.4–7.0)
Neutrophils: 56 %
Platelets: 207 10*3/uL (ref 150–450)
RBC: 4.62 x10E6/uL (ref 3.77–5.28)
RDW: 12.8 % (ref 11.7–15.4)
WBC: 4.7 10*3/uL (ref 3.4–10.8)

## 2022-11-11 LAB — LIPID PANEL
Chol/HDL Ratio: 2.4 ratio (ref 0.0–4.4)
Cholesterol, Total: 159 mg/dL (ref 100–199)
HDL: 66 mg/dL (ref >39)
LDL Chol Calc (NIH): 76 mg/dL (ref 0–99)
Triglycerides: 93 mg/dL (ref 0–149)
VLDL Cholesterol Cal: 17 mg/dL (ref 5–40)

## 2022-11-11 LAB — CMP14+EGFR
ALT: 26 IU/L (ref 0–32)
AST: 22 IU/L (ref 0–40)
Albumin/Globulin Ratio: 2.4 — ABNORMAL HIGH (ref 1.2–2.2)
Albumin: 4.5 g/dL (ref 3.9–4.9)
Alkaline Phosphatase: 46 IU/L (ref 44–121)
BUN/Creatinine Ratio: 17 (ref 12–28)
BUN: 13 mg/dL (ref 8–27)
Bilirubin Total: 0.7 mg/dL (ref 0.0–1.2)
CO2: 24 mmol/L (ref 20–29)
Calcium: 9.1 mg/dL (ref 8.7–10.3)
Chloride: 102 mmol/L (ref 96–106)
Creatinine, Ser: 0.75 mg/dL (ref 0.57–1.00)
Globulin, Total: 1.9 g/dL (ref 1.5–4.5)
Glucose: 104 mg/dL — ABNORMAL HIGH (ref 70–99)
Potassium: 4 mmol/L (ref 3.5–5.2)
Sodium: 141 mmol/L (ref 134–144)
Total Protein: 6.4 g/dL (ref 6.0–8.5)
eGFR: 88 mL/min/{1.73_m2} (ref >59)

## 2022-11-11 LAB — TSH: TSH: 1.28 u[IU]/mL (ref 0.450–4.500)

## 2022-11-13 ENCOUNTER — Other Ambulatory Visit (HOSPITAL_COMMUNITY): Payer: Self-pay | Admitting: Nurse Practitioner

## 2022-11-13 DIAGNOSIS — Z1231 Encounter for screening mammogram for malignant neoplasm of breast: Secondary | ICD-10-CM

## 2022-11-17 ENCOUNTER — Encounter: Payer: Self-pay | Admitting: Nurse Practitioner

## 2022-11-17 ENCOUNTER — Ambulatory Visit: Payer: Medicare HMO | Admitting: Nurse Practitioner

## 2022-11-17 VITALS — BP 128/78 | HR 75 | Temp 97.2°F | Ht 66.0 in | Wt 158.0 lb

## 2022-11-17 DIAGNOSIS — Z0000 Encounter for general adult medical examination without abnormal findings: Secondary | ICD-10-CM

## 2022-11-17 DIAGNOSIS — Z780 Asymptomatic menopausal state: Secondary | ICD-10-CM

## 2022-11-17 DIAGNOSIS — Z0001 Encounter for general adult medical examination with abnormal findings: Secondary | ICD-10-CM

## 2022-11-17 DIAGNOSIS — H04123 Dry eye syndrome of bilateral lacrimal glands: Secondary | ICD-10-CM | POA: Diagnosis not present

## 2022-11-17 DIAGNOSIS — J309 Allergic rhinitis, unspecified: Secondary | ICD-10-CM

## 2022-11-17 DIAGNOSIS — R682 Dry mouth, unspecified: Secondary | ICD-10-CM | POA: Diagnosis not present

## 2022-11-17 DIAGNOSIS — Z23 Encounter for immunization: Secondary | ICD-10-CM

## 2022-11-17 DIAGNOSIS — R9230 Dense breasts, unspecified: Secondary | ICD-10-CM

## 2022-11-17 DIAGNOSIS — E039 Hypothyroidism, unspecified: Secondary | ICD-10-CM

## 2022-11-17 DIAGNOSIS — Z01419 Encounter for gynecological examination (general) (routine) without abnormal findings: Secondary | ICD-10-CM

## 2022-11-17 MED ORDER — LEVOTHYROXINE SODIUM 50 MCG PO TABS: ORAL_TABLET | ORAL | 3 refills | Status: DC

## 2022-11-17 MED ORDER — ROSUVASTATIN CALCIUM 5 MG PO TABS: ORAL_TABLET | ORAL | 3 refills | Status: DC

## 2022-11-17 MED ORDER — MONTELUKAST SODIUM 10 MG PO TABS: ORAL_TABLET | ORAL | 3 refills | Status: DC

## 2022-11-17 NOTE — Progress Notes (Signed)
Subjective:    Patient ID: Katie Ingram, female    DOB: [birth year], 67 y.o.   MRN: [id number]  HPI AWV- Annual Wellness Visit  The patient was seen for their annual wellness visit. The patient's past medical history, surgical history, and family history were reviewed. Pertinent vaccines were reviewed ( tetanus, pneumonia, shingles, flu) The patient's medication list was reviewed and updated.  The height and weight were entered.  BMI recorded in electronic record elsewhere  Cognitive screening was completed. Outcome of Mini - Cog: 5   Falls /depression screening electronically recorded within record elsewhere  Current tobacco usage:none (All patients who use tobacco were given written and verbal information on quitting)  Recent listing of emergency department/hospitalizations over the past year were reviewed.  current specialist the patient sees on a regular basis:    Medicare annual wellness visit patient questionnaire was reviewed.  A written screening schedule for the patient for the next 5-10 years was given. Appropriate discussion of followup regarding next visit was discussed. Married, same female sexual partner.  No vaginal bleeding or pelvic pain. Regular walking for activity at least 10,000 steps per day. Healthy diet. Limited alcohol use. No smoking or vaping.  Gets regular skin cancer screening with dermatology. Has mammogram scheduled. Regular dental and eye exams. Continues to have very dry eyes and mouth. No interventions per optometrist except OTC products. Interested in work up for possible Sjogrens syndrome.     Review of Systems  Constitutional:  Negative for activity change, appetite change and fatigue.  HENT:  Negative for sore throat and trouble swallowing.   Respiratory:  Negative for cough, chest tightness, shortness of breath and wheezing.   Cardiovascular:  Negative for chest pain.  Gastrointestinal:  Negative for abdominal distention, abdominal  pain, constipation, diarrhea, nausea and vomiting.  Genitourinary:  Negative for difficulty urinating, dysuria, enuresis, frequency, pelvic pain, urgency, vaginal bleeding and vaginal discharge.      11/17/2022    8:36 AM 11/15/2021    8:14 AM 11/09/2020    8:29 AM 11/04/2019    2:31 PM 07/23/2018    8:14 AM  Depression screen PHQ 2/9  Decreased Interest 0 0 0 0 0  Down, Depressed, Hopeless 0 0 0 0 0  PHQ - 2 Score 0 0 0 0 0  Altered sleeping 2      Tired, decreased energy 1      Change in appetite 0      Feeling bad or failure about yourself  0      Trouble concentrating 0      Moving slowly or fidgety/restless 0      Suicidal thoughts 0      PHQ-9 Score 3      Difficult doing work/chores Not difficult at all          11/17/2022    8:38 AM  GAD 7 : Generalized Anxiety Score  Nervous, Anxious, on Edge 0  Control/stop worrying 0  Worry too much - different things 0  Trouble relaxing 0  Restless 0  Easily annoyed or irritable 0  Afraid - awful might happen 0  Total GAD 7 Score 0  Anxiety Difficulty Not difficult at all         Objective:   Physical Exam Constitutional:      General: She is not in acute distress.    Appearance: She is well-developed.  Neck:     Thyroid: No thyromegaly.     Trachea: No tracheal deviation.  Comments: Thyroid non tender to palpation. No mass or goiter noted.  Cardiovascular:     Rate and Rhythm: Normal rate and regular rhythm.     Heart sounds: Normal heart sounds. No murmur heard. Pulmonary:     Effort: Pulmonary effort is normal.     Breath sounds: Normal breath sounds.  Chest:  Breasts:    Right: No swelling, inverted nipple, mass, skin change or tenderness.     Left: No swelling, inverted nipple, mass, skin change or tenderness.     Comments: Dense tissue bilaterally. No dominant masses.  Abdominal:     General: There is no distension.     Palpations: Abdomen is soft.     Tenderness: There is no abdominal tenderness.   Genitourinary:    Comments: Defers pelvic and GU exam. Denies any problems.  Musculoskeletal:     Cervical back: Normal range of motion and neck supple.  Lymphadenopathy:     Cervical: No cervical adenopathy.     Upper Body:     Right upper body: No supraclavicular, axillary or pectoral adenopathy.     Left upper body: No supraclavicular, axillary or pectoral adenopathy.  Skin:    General: Skin is warm and dry.     Comments: Two healing wounds on the back from recent biopsies.   Neurological:     Mental Status: She is alert and oriented to person, place, and time.  Psychiatric:        Mood and Affect: Mood normal.        Behavior: Behavior normal.        Thought Content: Thought content normal.        Judgment: Judgment normal.     CBC with Differential/Platelet     Status: None   Collection Time: 11/10/22  8:41 AM  Result Value Ref Range   WBC 4.7 3.4 - 10.8 x10E3/uL   RBC 4.62 3.77 - 5.28 x10E6/uL   Hemoglobin 14.6 11.1 - 15.9 g/dL   Hematocrit 19.1 47.8 - 46.6 %   MCV 94 79 - 97 fL   MCH 31.6 26.6 - 33.0 pg   MCHC 33.6 31.5 - 35.7 g/dL   RDW 29.5 62.1 - 30.8 %   Platelets 207 150 - 450 x10E3/uL   Neutrophils 56 Not Estab. %   Lymphs 31 Not Estab. %   Monocytes 10 Not Estab. %   Eos 2 Not Estab. %   Basos 1 Not Estab. %   Neutrophils Absolute 2.6 1.4 - 7.0 x10E3/uL   Lymphocytes Absolute 1.5 0.7 - 3.1 x10E3/uL   Monocytes Absolute 0.5 0.1 - 0.9 x10E3/uL   EOS (ABSOLUTE) 0.1 0.0 - 0.4 x10E3/uL   Basophils Absolute 0.0 0.0 - 0.2 x10E3/uL   Immature Granulocytes 0 Not Estab. %   Immature Grans (Abs) 0.0 0.0 - 0.1 x10E3/uL  CMP14+EGFR     Status: Abnormal   Collection Time: 11/10/22  8:41 AM  Result Value Ref Range   Glucose 104 (H) 70 - 99 mg/dL   BUN 13 8 - 27 mg/dL   Creatinine, Ser 6.57 0.57 - 1.00 mg/dL   eGFR 88 >84 ON/GEX/5.28   BUN/Creatinine Ratio 17 12 - 28   Sodium 141 134 - 144 mmol/L   Potassium 4.0 3.5 - 5.2 mmol/L   Chloride 102 96 - 106 mmol/L    CO2 24 20 - 29 mmol/L   Calcium 9.1 8.7 - 10.3 mg/dL   Total Protein 6.4 6.0 - 8.5 g/dL   Albumin 4.5  3.9 - 4.9 g/dL   Globulin, Total 1.9 1.5 - 4.5 g/dL   Albumin/Globulin Ratio 2.4 (H) 1.2 - 2.2   Bilirubin Total 0.7 0.0 - 1.2 mg/dL   Alkaline Phosphatase 46 44 - 121 IU/L   AST 22 0 - 40 IU/L   ALT 26 0 - 32 IU/L  Lipid panel     Status: None   Collection Time: 11/10/22  8:41 AM  Result Value Ref Range   Cholesterol, Total 159 100 - 199 mg/dL   Triglycerides 93 0 - 149 mg/dL   HDL 66 >16 mg/dL   VLDL Cholesterol Cal 17 5 - 40 mg/dL   LDL Chol Calc (NIH) 76 0 - 99 mg/dL   Chol/HDL Ratio 2.4 0.0 - 4.4 ratio    Comment:                                   T. Chol/HDL Ratio                                             Men  Women                               1/2 Avg.Risk  3.4    3.3                                   Avg.Risk  5.0    4.4                                2X Avg.Risk  9.6    7.1                                3X Avg.Risk 23.4   11.0   TSH     Status: None   Collection Time: 11/10/22  8:41 AM  Result Value Ref Range   TSH 1.280 0.450 - 4.500 uIU/mL   Reviewed labs with patient during visit.     Assessment & Plan:   Problem List Items Addressed This Visit       Respiratory   Rhinitis, allergic   Relevant Medications   montelukast (SINGULAIR) 10 MG tablet     Endocrine   Hypothyroidism   Relevant Medications   levothyroxine (SYNTHROID) 50 MCG tablet     Other   Dense breasts   Other Visit Diagnoses     Initial Medicare annual wellness visit    -  Primary   Well woman exam       Post-menopausal       Relevant Orders   DG Bone Density   Immunization due       Relevant Orders   Pneumococcal conjugate vaccine 20-valent (Prevnar 20) (Completed)   Chronic dryness of both eyes       Relevant Orders   Ambulatory referral to Rheumatology   Dry mouth       Relevant Orders   Ambulatory referral to Rheumatology      Meds ordered this encounter   Medications   levothyroxine (SYNTHROID) 50 MCG  tablet    Sig: TAKE (1) TABLET BY MOUTH ONCE DAILY.    Dispense:  90 tablet    Refill:  3    Order Specific Question:   Supervising Provider    Answer:   Lilyan Punt A [[id number]]   montelukast (SINGULAIR) 10 MG tablet    Sig: TAKE (1) TABLET BY MOUTH ONCE DAILY.    Dispense:  90 tablet    Refill:  3    Order Specific Question:   Supervising Provider    Answer:   Lilyan Punt A [[id number]]   rosuvastatin (CRESTOR) 5 MG tablet    Sig: TAKE (1) TABLET BY MOUTH DAILY ON MONDAY, WEDNESDAY AND FRIDAY.    Dispense:  36 tablet    Refill:  3    Order Specific Question:   Supervising Provider    Answer:   Lilyan Punt A [[id number]]   Continue healthy lifestyle habits.  Continue current medications at same dose. Prevnar 20 vaccine today. Schedule bone density scan. Referral to rheumatology for evaluation of possible Sjogren's syndrome. Return in about 1 year (around 11/17/2023) for physical.

## 2022-11-19 ENCOUNTER — Ambulatory Visit (HOSPITAL_COMMUNITY)
Admission: RE | Admit: 2022-11-19 | Discharge: 2022-11-19 | Disposition: A | Discharge status: Home / Self Care | Payer: Medicare HMO | Source: Ambulatory Visit | Attending: Nurse Practitioner | Admitting: Nurse Practitioner

## 2022-11-19 DIAGNOSIS — Z780 Asymptomatic menopausal state: Secondary | ICD-10-CM | POA: Insufficient documentation

## 2022-11-21 DIAGNOSIS — H52223 Regular astigmatism, bilateral: Secondary | ICD-10-CM | POA: Diagnosis not present

## 2022-11-21 DIAGNOSIS — H5203 Hypermetropia, bilateral: Secondary | ICD-10-CM | POA: Diagnosis not present

## 2022-11-21 DIAGNOSIS — H524 Presbyopia: Secondary | ICD-10-CM | POA: Diagnosis not present

## 2022-11-25 DIAGNOSIS — Z08 Encounter for follow-up examination after completed treatment for malignant neoplasm: Secondary | ICD-10-CM | POA: Diagnosis not present

## 2022-11-25 DIAGNOSIS — D485 Neoplasm of uncertain behavior of skin: Secondary | ICD-10-CM | POA: Diagnosis not present

## 2022-11-25 DIAGNOSIS — Z85828 Personal history of other malignant neoplasm of skin: Secondary | ICD-10-CM | POA: Diagnosis not present

## 2022-12-05 ENCOUNTER — Ambulatory Visit (HOSPITAL_COMMUNITY)
Admission: RE | Admit: 2022-12-05 | Discharge: 2022-12-05 | Disposition: A | Discharge status: Home / Self Care | Payer: Medicare HMO | Source: Ambulatory Visit | Attending: Nurse Practitioner | Admitting: Nurse Practitioner

## 2022-12-05 DIAGNOSIS — Z1231 Encounter for screening mammogram for malignant neoplasm of breast: Secondary | ICD-10-CM | POA: Diagnosis not present

## 2022-12-10 ENCOUNTER — Ambulatory Visit: Payer: Medicare HMO | Attending: Rheumatology | Admitting: Rheumatology

## 2022-12-10 ENCOUNTER — Encounter: Payer: Self-pay | Admitting: Rheumatology

## 2022-12-10 VITALS — BP 142/83 | HR 73 | Resp 14 | Ht 66.0 in | Wt 161.0 lb

## 2022-12-10 DIAGNOSIS — F5104 Psychophysiologic insomnia: Secondary | ICD-10-CM | POA: Diagnosis not present

## 2022-12-10 DIAGNOSIS — Z13820 Encounter for screening for osteoporosis: Secondary | ICD-10-CM | POA: Diagnosis not present

## 2022-12-10 DIAGNOSIS — J3089 Other allergic rhinitis: Secondary | ICD-10-CM

## 2022-12-10 DIAGNOSIS — E038 Other specified hypothyroidism: Secondary | ICD-10-CM

## 2022-12-10 DIAGNOSIS — M3500 Sicca syndrome, unspecified: Secondary | ICD-10-CM | POA: Diagnosis not present

## 2022-12-10 DIAGNOSIS — M25561 Pain in right knee: Secondary | ICD-10-CM | POA: Diagnosis not present

## 2022-12-10 DIAGNOSIS — E7849 Other hyperlipidemia: Secondary | ICD-10-CM

## 2022-12-10 DIAGNOSIS — Z8719 Personal history of other diseases of the digestive system: Secondary | ICD-10-CM | POA: Diagnosis not present

## 2022-12-10 DIAGNOSIS — M19042 Primary osteoarthritis, left hand: Secondary | ICD-10-CM

## 2022-12-10 DIAGNOSIS — M19041 Primary osteoarthritis, right hand: Secondary | ICD-10-CM | POA: Diagnosis not present

## 2022-12-10 DIAGNOSIS — R011 Cardiac murmur, unspecified: Secondary | ICD-10-CM | POA: Diagnosis not present

## 2022-12-10 DIAGNOSIS — G8929 Other chronic pain: Secondary | ICD-10-CM

## 2022-12-10 DIAGNOSIS — M25562 Pain in left knee: Secondary | ICD-10-CM

## 2022-12-10 MED ORDER — PILOCARPINE HCL 5 MG PO TABS: 5.0000 mg | ORAL_TABLET | Freq: Three times a day (TID) | ORAL | 1 refills | Status: DC | PRN

## 2022-12-10 NOTE — Progress Notes (Signed)
Office Visit Note  Patient: Katie Ingram             Date of Birth: 12-29-1955           MRN: [id number]             PCP: Tommie Sams, DO Referring: Campbell Riches, NP Visit Date: 12/10/2022 Occupation: @GUAROCC @  Subjective:  Dry mouth and dry eyes   History of Present Illness: Katie Ingram is a 67 y.o. female seen in consultation per request of Ms. Hoskins NP for the evaluation of dryness.  According the patient she has had knee joint discomfort since she was in high school.  She played tennis for high school.  She has always been active.  She states she walks about 3 miles daily.  She states she has mild discomfort in her knee joints but no joint swelling.  She also has discomfort in the bilateral hands.  She has seen Dr. Cheree Ditto in the past who diagnosed her with [facility] arthritis and suggested surgery in the future.  None of the other joints are painful.  She states for the last 3 years she has been experiencing extremely dry eyes for which she was seen by an ophthalmologist.  She was advised Systane ultra eyedrops which she has been using during the daytime.  She is using a 9 point at nighttime.  Despite of that she continues to have dry eyes.  She also has been experiencing dry mouth for the last few years.  She related to medications she has been taking for allergies.  She had a physical with her PCP last year and mentioned about her symptoms.  She was referred to me for further evaluation.  History of parotid swelling.  She notices mild dry skin and vaginal dryness.  She denies history of shortness of breath or palpitations.  No history of lymphadenopathy.  There is no family history of autoimmune disease.  She states her mother and daughter both have dry mouth and dry eye symptoms.  She is gravida 2, para 2.  There is no history of DVTs.  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  She enjoys gardening crafts and works at [employer].    Activities of  Daily Living:  Patient reports morning stiffness for 5-10 minutes.   Patient Reports nocturnal pain.  Difficulty dressing/grooming: Denies Difficulty climbing stairs: Denies Difficulty getting out of chair: Denies Difficulty using hands for taps, buttons, cutlery, and/or writing: Reports  Review of Systems  Constitutional:  Positive for fatigue.  HENT:  Positive for mouth dryness. Negative for mouth sores.   Eyes:  Positive for dryness.  Respiratory:  Negative for shortness of breath.   Cardiovascular:  Negative for chest pain and palpitations.  Gastrointestinal:  Negative for blood in stool, constipation and diarrhea.  Endocrine: Negative for increased urination.  Genitourinary:  Negative for involuntary urination.  Musculoskeletal:  Positive for joint pain, joint pain, joint swelling, myalgias, morning stiffness and myalgias. Negative for gait problem, muscle weakness and muscle tenderness.  Skin:  Negative for color change, rash, hair loss and sensitivity to sunlight.  Allergic/Immunologic: Negative for susceptible to infections.  Neurological:  Positive for dizziness. Negative for headaches.  Hematological:  Negative for swollen glands.  Psychiatric/Behavioral:  Positive for sleep disturbance. Negative for depressed mood. The patient is not nervous/anxious.     PMFS History:  Patient Active Problem List   Diagnosis Date Noted   Dense  breasts 11/17/2022   Other hyperlipidemia 11/16/2021   Hypoestrogenism 11/10/2020   Acute bacterial rhinosinusitis 05/08/2020   Chronic insomnia 11/04/2019   Systolic murmur 06/05/2017   Rhinitis, allergic 01/18/2016   Hypothyroidism 11/08/2012    Past Medical History:  Diagnosis Date   Allergic rhinitis    H/O goiter    Hypothyroidism    Seasonal allergies     Family History  Problem Relation Age of Onset   Hypertension Mother    Arthritis Mother    Endometrial cancer Mother    Hypertension Father    Hyperlipidemia Father     Hypertension Brother    Arthritis Maternal Grandmother    Cancer Other        colon   Colon cancer Other    Past Surgical History:  Procedure Laterality Date   COLONOSCOPY     COLONOSCOPY N/A 01/05/2019   Procedure: COLONOSCOPY;  Surgeon: Corbin Ade, MD;  Location: [facility];  Service: Endoscopy;  Laterality: N/A;  10:30   DILATION AND CURETTAGE OF UTERUS     ENDOMETRIAL ABLATION     ESOPHAGOGASTRODUODENOSCOPY     POLYPECTOMY  01/05/2019   Procedure: POLYPECTOMY;  Surgeon: Corbin Ade, MD;  Location: [facility];  Service: Endoscopy;;  splenic flexurex2   THYROID SURGERY     TONSILLECTOMY AND ADENOIDECTOMY     TUBAL LIGATION     Social History   Social History Narrative   Not on file   Immunization History  Administered Date(s) Administered   Influenza,inj,Quad PF,6+ Mos 03/23/2020   Influenza-Unspecified 04/23/2021   Moderna Sars-Covid-2 Vaccination 07/21/2019, 08/18/2019, 04/22/2020   PNEUMOCOCCAL CONJUGATE-20 11/17/2022   Tdap 07/23/2018   Zoster Recombinant(Shingrix) 07/31/2017, 11/12/2017   Zoster, Live 09/18/2011     Objective: Vital Signs: BP (!) 142/83 (BP Location: Left Arm, Patient Position: Sitting, Cuff Size: Normal)   Pulse 73   Resp 14   Ht 5\' 6"  (1.676 m)   Wt 161 lb (73 kg)   BMI 25.99 kg/m    Physical Exam Vitals and nursing note reviewed.  Constitutional:      Appearance: She is well-developed.  HENT:     Head: Normocephalic and atraumatic.  Eyes:     Conjunctiva/sclera: Conjunctivae normal.  Neck:     Comments: No parotid swelling was noted. Cardiovascular:     Rate and Rhythm: Normal rate and regular rhythm.     Heart sounds: Normal heart sounds.  Pulmonary:     Effort: Pulmonary effort is normal.     Breath sounds: Normal breath sounds.  Abdominal:     General: Bowel sounds are normal.     Palpations: Abdomen is soft.  Musculoskeletal:     Cervical back: Normal range of motion.  Lymphadenopathy:     Cervical: No  cervical adenopathy.  Skin:    General: Skin is warm and dry.     Capillary Refill: Capillary refill takes less than 2 seconds.     Comments: Surgical scar from thyroidectomy was noted.  Neurological:     Mental Status: She is alert and oriented to person, place, and time.  Psychiatric:        Behavior: Behavior normal.      Musculoskeletal Exam: Cervical, thoracic and lumbar spine were in good range of motion.  Shoulder joints, elbow joints, wrist joints, MCPs PIPs and DIPs were in good range of motion.  She had bilateral CMC and DIP thickening.  She had bilateral CMC subluxation.  Hip joints and knee  joints in good range of motion.  No warmth swelling or effusion was noted.  There was no tenderness over ankles or MTPs.  Prominence of PIP and DIP joints with no synovitis was noted.  There was no Planter fasciitis or Achilles tendinitis.  CDAI Exam: CDAI Score: -- Patient Global: --; Provider Global: -- Swollen: --; Tender: -- Joint Exam 12/10/2022   No joint exam has been documented for this visit   There is currently no information documented on the homunculus. Go to the Rheumatology activity and complete the homunculus joint exam.  Investigation: No additional findings.  Imaging: MM 3D SCREENING MAMMOGRAM BILATERAL BREAST  Result Date: 12/09/2022 CLINICAL DATA:  Screening. EXAM: DIGITAL SCREENING BILATERAL MAMMOGRAM WITH TOMOSYNTHESIS AND CAD TECHNIQUE: Bilateral screening digital craniocaudal and mediolateral oblique mammograms were obtained. Bilateral screening digital breast tomosynthesis was performed. The images were evaluated with computer-aided detection. COMPARISON:  Previous exam(s). ACR Breast Density Category b: There are scattered areas of fibroglandular density. FINDINGS: There are no findings suspicious for malignancy. IMPRESSION: No mammographic evidence of malignancy. A result letter of this screening mammogram will be mailed directly to the patient. RECOMMENDATION:  Screening mammogram in one year. (Code:SM-B-01Y) BI-RADS CATEGORY  1: Negative. Electronically Signed   By: Norva Pavlov M.D.   On: 12/09/2022 09:36   DG Bone Density  Result Date: 11/19/2022 EXAM: DUAL X-RAY ABSORPTIOMETRY (DXA) FOR BONE MINERAL DENSITY IMPRESSION: Your patient Jeannifer Macadangdang completed a BMD test on 11/19/2022 using the [employer] DXA System (software version: 14.10) manufactured by [employer]. The following summarizes the results of our evaluation. Technologist: KRK PATIENT BIOGRAPHICAL: Name: Vetta, Loven Patient ID: [id number] Birth Date: Oct 13, 1955 Height: 66.0 in. Gender: Female Exam Date: 11/19/2022 Weight: 158.0 lbs. Indications: Caucasian, Height Loss, Post Menopausal Fractures: Treatments: Calcium, Multivitamin, Synthroid DENSITOMETRY RESULTS: Site      Region     Measured Date Measured Age WHO Classification Young Adult T-score BMD         %Change vs. Previous Significant Change (*) [facility] Spine L1-L3 11/19/2022 65.5 Normal 1.9 1.401 g/cm2 0.2% - [facility] Spine L1-L3 06/17/2017 60.1 Normal 1.9 1.398 g/cm2 - - DualFemur Neck Right 11/19/2022 65.5 Normal 0.1 1.051 g/cm2 -2.7% - DualFemur Neck Right 06/17/2017 60.1 Normal 0.3 1.080 g/cm2 - - DualFemur Total Mean 11/19/2022 65.5 Normal 1.5 1.201 g/cm2 0.7% - DualFemur Total Mean 06/17/2017 60.1 Normal 1.5 1.193 g/cm2 - - ASSESSMENT: The BMD measured at Femur Neck Right is 1.051 g/cm2 with a T-score of 0.1. This patient is considered normal according to World Health Organization [facility]) criteria. The scan quality is good. L4 was excluded due to advanced degenerative changes. Compared with the prior study on 06/17/2017 , the BMD of the total mean shows no statistically significant change. World Science writer [facility]) criteria for post-menopausal, Caucasian Women: Normal:       T-score at or above -1 SD Osteopenia:   T-score between -1 and -2.5 SD Osteoporosis: T-score at or below -2.5 SD RECOMMENDATIONS: 1. All patients should  optimize calcium and vitamin D intake. 2. Consider FDA-approved medical therapies in postmenopausal women and med aged 43 years and older, based on the following: a. A hip or vertebral (clinical or morphometric) fracture b. T-score< -2.5 at the femoral neck or spine after appropriate evaluation to exclude secondary causes c. Low bone mass (T-score between -1.0 and -2.5 at the femoral neck or spine) and a 10-year probability of a hip fracture > 3% or a 10-year probability of a major osteoporosis-related fracture >  20% based on the [place]-adapted WHO algorithm d. Clinician judgment and/or patient preferences may indicate treatment for people with 10-year fracture probabilities above or below these levels FOLLOW-UP: Patients with diagnosis of osteoporsis or at high risk for fracture should have regular bone mineral density tests. For patients eligible for Medicare, routine testing is allowed once every 2 years. The testing frequency can be increased to one year for patients who have rapidly progressing disease, those who are receiving or discontinuing medical therapy to restore bone mass, or have additional risk factors. I have reviewed this report, and agree with the above findings. [facility] Electronically Signed   By: Gerome Sam III M.D.   On: 11/19/2022 10:58    Recent Labs: Lab Results  Component Value Date   WBC 4.7 11/10/2022   HGB 14.6 11/10/2022   PLT 207 11/10/2022   NA 141 11/10/2022   K 4.0 11/10/2022   CL 102 11/10/2022   CO2 24 11/10/2022   GLUCOSE 104 (H) 11/10/2022   BUN 13 11/10/2022   CREATININE 0.75 11/10/2022   BILITOT 0.7 11/10/2022   ALKPHOS 46 11/10/2022   AST 22 11/10/2022   ALT 26 11/10/2022   PROT 6.4 11/10/2022   ALBUMIN 4.5 11/10/2022   CALCIUM 9.1 11/10/2022   GFRAA 86 10/26/2019    Speciality Comments: No specialty comments available.  Procedures:  No procedures performed Allergies: Bactrim [sulfamethoxazole-trimethoprim], Biaxin  [clarithromycin], Ceftin [cefuroxime axetil], Compazine [prochlorperazine edisylate], Floxacillin [floxacillin (flucloxacillin)], Levaquin [levofloxacin in d5w], and Tobramycin   Assessment / Plan:     Visit Diagnoses: Sicca syndrome (HCC) -patient gives history of dry mouth and dry eyes for at least 3 years.  She states symptoms have been gradually getting worse.  She was evaluated by her ophthalmologist and was advised to use over-the-counter eyedrops.  She has been using eyedrops on a regular basis.  She gives history of dry mouth for several years which she related to the allergy medications.  She also gives history of mild dry skin and vaginal dryness.  There is no history of palpitations, shortness of breath, parotid swelling or lymphadenopathy.  She states her mother and daughter both have dry mouth and dry eye symptoms.  She usually chews on her to give moisture to her mouth and drinks water frequently.  Patient states she had recently had her eye examination and her intraocular pressure was normal.  Detailed counseling regarding dry mouth and dry eye symptoms was provided.  Association of sicca symptoms with Sjogren's was also discussed.  I will obtain autoimmune labs today.  Plan: Sedimentation rate, Rheumatoid factor, ANA, RNP Antibody, Anti-Smith antibody, Sjogrens syndrome-A extractable nuclear antibody, Sjogrens syndrome-B extractable nuclear antibody, Anti-DNA antibody, double-stranded, C3 and C4.  I will discuss results at the follow-up visit. I discussed the option of starting her on pilocarpine due to significant sicca symptoms.  Indications side effects contraindications were discussed and a handout was placed in the AVS.  Patient wanted to proceed with pilocarpine.  Pilocarpine (SALAGEN) 5 MG tablet 3 times daily as needed 90 tablets with 1 refill was sent.  I advised her to contact us if she develops any side effects.  Over-the-counter products were also discussed at length.  Primary  osteoarthritis of both hands-she continues to have pain and stiffness in her hands.  She had bilateral CMC and DIP thickening.  Patient has seen Dr. Amanda Pea in the past and had x-rays of her hands.  Patient states she was diagnosed with osteoarthritis and CMC surgery  was suggested in the future.  She does crafts and gardening which is a strenuous on her hands.  Joint protection muscle strengthening was discussed.  A handout on hand exercises was given.  Benefits of using CMC brace were also discussed.  Chronic pain of both knees-she gives history of knee joint pain since she was a teenager.  She states she was very active and played tennis in high school.  She has been walking 3 miles daily.  She has minimal discomfort in her knee joints.  She denies any history of joint swelling.  She had good range of motion without any warmth swelling or effusion.  A handout on lower extremity muscle strengthening exercises was given.  Other specified hypothyroidism-patient had thyroidectomy due to goiter when she was young.  Systolic murmur-no murmur was audible today.  History of gastroesophageal reflux (GERD)-he is on Pepcid.  She states her symptoms are well-controlled.  Seasonal allergic rhinitis due to other allergic trigger-she uses Flonase nasal spray, Singulair 10 mg tablet.  Chronic insomnia-good sleep hygiene was discussed.  Other hyperlipidemia-she is on Crestor 5 mg on Monday Wednesday and Friday.  Osteoporosis screening - DEXA scan normal on November 19, 2022.  Orders: Orders Placed This Encounter  Procedures   Sedimentation rate   Rheumatoid factor   ANA   RNP Antibody   Anti-Smith antibody   Sjogrens syndrome-A extractable nuclear antibody   Sjogrens syndrome-B extractable nuclear antibody   Anti-DNA antibody, double-stranded   C3 and C4   Meds ordered this encounter  Medications   pilocarpine (SALAGEN) 5 MG tablet    Sig: Take 1 tablet (5 mg total) by mouth 3 (three) times daily as  needed.    Dispense:  90 tablet    Refill:  1    Follow-Up Instructions: Return for sicca.   Pollyann Savoy, MD  Note - This record has been created using Animal nutritionist.  Chart creation errors have been sought, but may not always  have been located. Such creation errors do not reflect on  the standard of medical care.

## 2022-12-10 NOTE — Patient Instructions (Addendum)
Hand Exercises Hand exercises can be helpful for almost anyone. They can strengthen your hands and improve flexibility and movement. The exercises can also increase blood flow to the hands. These results can make your work and daily tasks easier for you. Hand exercises can be especially helpful for people who have joint pain from arthritis or nerve damage from using their hands over and over. These exercises can also help people who injure a hand. Exercises Most of these hand exercises are gentle stretching and motion exercises. It is usually safe to do them often throughout the day. Warming up your hands before exercise may help reduce stiffness. You can do this with gentle massage or by placing your hands in warm water for 10-15 minutes. It is normal to feel some stretching, pulling, tightness, or mild discomfort when you begin new exercises. In time, this will improve. Remember to always be careful and stop right away if you feel sudden, very bad pain or your pain gets worse. You want to get better and be safe. Ask your health care provider which exercises are safe for you. Do exercises exactly as told by your provider and adjust them as told. Do not begin these exercises until told by your provider. Knuckle bend or "claw" fist  Stand or sit with your arm, hand, and all five fingers pointed straight up. Make sure to keep your wrist straight. Gently bend your fingers down toward your palm until the tips of your fingers are touching your palm. Keep your big knuckle straight and only bend the small knuckles in your fingers. Hold this position for 10 seconds. Straighten your fingers back to your starting position. Repeat this exercise 5-10 times with each hand. Full finger fist  Stand or sit with your arm, hand, and all five fingers pointed straight up. Make sure to keep your wrist straight. Gently bend your fingers into your palm until the tips of your fingers are touching the middle of your  palm. Hold this position for 10 seconds. Extend your fingers back to your starting position, stretching every joint fully. Repeat this exercise 5-10 times with each hand. Straight fist  Stand or sit with your arm, hand, and all five fingers pointed straight up. Make sure to keep your wrist straight. Gently bend your fingers at the big knuckle, where your fingers meet your hand, and at the middle knuckle. Keep the knuckle at the tips of your fingers straight and try to touch the bottom of your palm. Hold this position for 10 seconds. Extend your fingers back to your starting position, stretching every joint fully. Repeat this exercise 5-10 times with each hand. Tabletop  Stand or sit with your arm, hand, and all five fingers pointed straight up. Make sure to keep your wrist straight. Gently bend your fingers at the big knuckle, where your fingers meet your hand, as far down as you can. Keep the small knuckles in your fingers straight. Think of forming a tabletop with your fingers. Hold this position for 10 seconds. Extend your fingers back to your starting position, stretching every joint fully. Repeat this exercise 5-10 times with each hand. Finger spread  Place your hand flat on a table with your palm facing down. Make sure your wrist stays straight. Spread your fingers and thumb apart from each other as far as you can until you feel a gentle stretch. Hold this position for 10 seconds. Bring your fingers and thumb tight together again. Hold this position for 10 seconds. Repeat   this exercise 5-10 times with each hand. Making circles  Stand or sit with your arm, hand, and all five fingers pointed straight up. Make sure to keep your wrist straight. Make a circle by touching the tip of your thumb to the tip of your index finger. Hold for 10 seconds. Then open your hand wide. Repeat this motion with your thumb and each of your fingers. Repeat this exercise 5-10 times with each hand. Thumb  motion  Sit with your forearm resting on a table and your wrist straight. Your thumb should be facing up toward the ceiling. Keep your fingers relaxed as you move your thumb. Lift your thumb up as high as you can toward the ceiling. Hold for 10 seconds. Bend your thumb across your palm as far as you can, reaching the tip of your thumb for the small finger (pinkie) side of your palm. Hold for 10 seconds. Repeat this exercise 5-10 times with each hand. Grip strengthening  Hold a stress ball or other soft ball in the middle of your hand. Slowly increase the pressure, squeezing the ball as much as you can without causing pain. Think of bringing the tips of your fingers into the middle of your palm. All of your finger joints should bend when doing this exercise. Hold your squeeze for 10 seconds, then relax. Repeat this exercise 5-10 times with each hand. Contact a health care provider if: Your hand pain or discomfort gets much worse when you do an exercise. Your hand pain or discomfort does not improve within 2 hours after you exercise. If you have either of these problems, stop doing these exercises right away. Do not do them again unless your provider says that you can. Get help right away if: You develop sudden, severe hand pain or swelling. If this happens, stop doing these exercises right away. Do not do them again unless your provider says that you can. This information is not intended to replace advice given to you by your health care provider. Make sure you discuss any questions you have with your health care provider. Document Revised: 06/10/2022 Document Reviewed: 06/10/2022 Elsevier Patient Education  2024 [employer] Exercises for Chronic Knee Pain Chronic knee pain is pain that lasts longer than 3 months. For most people with chronic knee pain, exercise and weight loss is an important part of treatment. Your health care provider may want you to focus on: Making the muscles that  support your knee stronger. This can take pressure off your knee and reduce pain. Preventing knee stiffness. How far you can move your knee, keeping it there or making it farther. Losing weight (if this applies) to take pressure off your knee, lower your risk for injury, and make it easier for you to exercise. Your provider will help you make an exercise program that fits your needs and physical abilities. Below are simple, low-impact exercises you can do at home. Ask your provider or physical therapist how often you should do your exercise program and how many times to repeat each exercise. General safety tips  Get your provider's approval before doing any exercises. Start slowly and stop any time you feel pain. Do not exercise if your knee pain is flaring up. Warm up first. Stretching a cold muscle can cause an injury. Do 5-10 minutes of easy movement or light stretching before beginning your exercises. Do 5-10 minutes of low-impact activity (like walking or cycling) before starting strengthening exercises. Contact your provider any time you have pain during   or after exercising. Exercise can cause discomfort but should not be painful. It is normal to be a little stiff or sore after exercising. Stretching and range-of-motion exercises Front thigh stretch  Stand up straight and support your body by holding on to a chair or resting one hand on a wall. With your legs straight and close together, bend one knee to lift your heel up toward your butt. Using one hand for support, grab your ankle with your free hand. Pull your foot up closer toward your butt to feel the stretch in front of your thigh. Hold the stretch for 30 seconds. Repeat __________ times. Complete this exercise __________ times a day. Back thigh stretch  Sit on the floor with your back straight and your legs out straight in front of you. Place the palms of your hands on the floor and slide them toward your feet as you bend at the  hip. Try to touch your nose to your knees and feel the stretch in the back of your thighs. Hold for 30 seconds. Repeat __________ times. Complete this exercise __________ times a day. Calf stretch  Stand facing a wall. Place the palms of your hands flat against the wall, arms extended, and lean slightly against the wall. Get into a lunge position with one leg bent at the knee and the other leg stretched out straight behind you. Keep both feet facing the wall and increase the bend in your knee while keeping the heel of the other leg flat on the ground. You should feel the stretch in your calf. Hold for 30 seconds. Repeat __________ times. Complete this exercise __________ times a day. Strengthening exercises Straight leg lift  Lie on your back with one knee bent and the other leg out straight. Slowly lift the straight leg without bending the knee. Lift until your foot is about 12 inches (30 cm) off the floor. Hold for 3-5 seconds and slowly lower your leg. Repeat __________ times. Complete this exercise __________ times a day. Single leg dip  Stand between two chairs and put both hands on the backs of the chairs for support. Extend one leg out straight with your body weight resting on the heel of the standing leg. Slowly bend your standing knee to dip your body to the level that is comfortable for you. Hold for 3-5 seconds. Repeat __________ times. Complete this exercise __________ times a day. Hamstring curls  Stand straight, knees close together, facing the back of a chair. Hold on to the back of a chair with both hands. Keep one leg straight. Bend the other knee while bringing the heel up toward the butt until the knee is bent at a 90-degree angle (right angle). Hold for 3-5 seconds. Repeat __________ times. Complete this exercise __________ times a day. Wall squat  Stand straight with your back, hips, and head against a wall. Step forward one foot at a time with your back  still against the wall. Your feet should be 2 feet (61 cm) from the wall at shoulder width. Keeping your back, hips, and head against the wall, slide down the wall to as close to a sitting position as you can get. Hold for 5-10 seconds, then slowly slide back up. Repeat __________ times. Complete this exercise __________ times a day. Step-ups  Stand in front of a sturdy platform or stool that is about 6 inches (15 cm) high. Slowly step up with your left / right foot, keeping your knee in line with your hip  and foot. Do not let your knee bend so far that you cannot see your toes. Hold on to a chair for balance, but do not use it for support. Slowly unlock your knee and lower yourself to the starting position. Repeat __________ times. Complete this exercise __________ times a day. Contact a health care provider if: Your exercises cause pain. Your pain is worse after you exercise. Your pain prevents you from doing your exercises. This information is not intended to replace advice given to you by your health care provider. Make sure you discuss any questions you have with your health care provider. Document Revised: 06/10/2022 Document Reviewed: 06/10/2022 Elsevier Patient Education  2024 [employer] Inc.   Pilocarpine Tablets What is this medication? PILOCARPINE (PYE loe KAR peen) treats dry mouth. It works by increasing the amount of saliva in the mouth, which makes it easier to speak and swallow. This medicine may be used for other purposes; ask your health care provider or pharmacist if you have questions. COMMON BRAND NAME(S): Salagen What should I tell my care team before I take this medication? They need to know if you have any of these conditions: Eye infection or other eye problems Glaucoma Heart disease Liver disease Lung or breathing disease, such as asthma An unusual or allergic reaction to pilocarpine, other medications, foods, dyes, or preservatives Pregnant or trying to get  pregnant Breast-feeding How should I use this medication? Take this medication by mouth with a full glass of water. Take it as directed on the prescription label at the same time every day. Keep taking it unless your care team tells you to stop. Talk to your care team about the use of this medication in children. Special care may be needed. Overdosage: If you think you have taken too much of this medicine contact a poison control center or emergency room at once. NOTE: This medicine is only for you. Do not share this medicine with others. What if I miss a dose? If you miss a dose, take it as soon as you can. If it is almost time for your next dose, take only that dose. Do not take double or extra doses. What may interact with this medication? Antihistamines for allergy, cough, and cold Atropine Certain medications for Alzheimer disease, such as donepezil, galantamine, rivastigmine Certain medications for bladder problems, such as bethanechol, oxybutynin, tolterodine Certain medications for Parkinson disease, such as benztropine, trihexyphenidyl Certain medications for quitting smoking, such as nicotine Certain medications for stomach problems, such as dicyclomine, hyoscyamine Certain medications for travel sickness, such as scopolamine Ipratropium Medications for blood pressure or heart problems, such as metoprolol This list may not describe all possible interactions. Give your health care provider a list of all the medicines, herbs, non-prescription drugs, or dietary supplements you use. Also tell them if you smoke, drink alcohol, or use illegal drugs. Some items may interact with your medicine. What should I watch for while using this medication? Visit your care team for regular checks on your progress. Tell your care team if your symptoms do not get better or if they get worse. You may get blurry vision or have trouble telling how far something is from you. This may be a problem at night or  when the lights are low. Do not drive, use machinery, or do anything that needs clear vision until you know how this medication affects you. If you sweat a lot, drink enough to replace fluids. Do not get dehydrated. What side effects may I notice  from receiving this medication? Side effects that you should report to your care team as soon as possible: Allergic reactions--skin rash, itching, hives, swelling of the face, lips, tongue, or throat Fast or irregular heartbeat Increase in blood pressure Low blood pressure--dizziness, feeling faint or lightheaded, blurry vision Slow heartbeat--dizziness, feeling faint or lightheaded, confusion, trouble breathing, unusual weakness or fatigue Side effects that usually do not require medical attention (report to your care team if they continue or are bothersome): Change in vision Chills Diarrhea Excessive sweating Flushing Headache Increased need to urinate This list may not describe all possible side effects. Call your doctor for medical advice about side effects. You may report side effects to FDA at 1-800-FDA-1088. Where should I keep my medication? Keep out of the reach of children and pets. Store at room temperature between 15 and 30 degrees C (59 and 86 degrees F). Get rid of any unused medication after the expiration date. To get rid of medications that are no longer needed or have expired: Take the medications to a medication take-back program. Check with your pharmacy or law enforcement to find a location. If your cannot return the medication, check the label or package insert to see if the medication should be thrown out in the garbage or flushed down the toilet. If you are not sure, ask your care team. If it is safe to put it in the trash, take the medication out of the container. Mix the medication with cat litter, dirt, coffee grounds, or other unwanted substance. Seal the mixture in a bag or container. Put it in the trash. NOTE: This sheet  is a summary. It may not cover all possible information. If you have questions about this medicine, talk to your doctor, pharmacist, or health care provider.  2024 Elsevier/Gold Standard (2021-05-10 00:00:00)

## 2022-12-14 LAB — SJOGRENS SYNDROME-A EXTRACTABLE NUCLEAR ANTIBODY: SSA (Ro) (ENA) Antibody, IgG: <1 AI

## 2022-12-14 LAB — RHEUMATOID FACTOR: Rheumatoid fact SerPl-aCnc: <10 IU/mL (ref <14)

## 2022-12-14 LAB — SJOGRENS SYNDROME-B EXTRACTABLE NUCLEAR ANTIBODY: SSB (La) (ENA) Antibody, IgG: <1 AI

## 2022-12-14 LAB — ANTI-NUCLEAR AB-TITER (ANA TITER): ANA Titer 1: 1:80 {titer} — ABNORMAL HIGH

## 2022-12-14 LAB — ANTI-SMITH ANTIBODY: ENA SM Ab Ser-aCnc: <1 AI

## 2022-12-14 LAB — RNP ANTIBODY: Ribonucleic Protein(ENA) Antibody, IgG: <1 AI

## 2022-12-14 LAB — C3 AND C4
C3 Complement: 134 mg/dL (ref 83–193)
C4 Complement: 22 mg/dL (ref 15–57)

## 2022-12-14 LAB — ANA: Anti Nuclear Antibody (ANA): POSITIVE — AB

## 2022-12-14 LAB — SEDIMENTATION RATE: Sed Rate: 2 mm/h (ref 0–30)

## 2022-12-14 LAB — ANTI-DNA ANTIBODY, DOUBLE-STRANDED: ds DNA Ab: <1 IU/mL

## 2022-12-14 NOTE — Progress Notes (Signed)
ANA is low titer positive, ENA panel is negative, complements are normal.  Rheumatoid factor negative, sed rate normal and do not indicate inflammation.I will discuss results at the follow uo visit.

## 2022-12-26 ENCOUNTER — Ambulatory Visit
Admission: RE | Admit: 2022-12-26 | Discharge: 2022-12-26 | Disposition: A | Discharge status: Home / Self Care | Payer: Medicare HMO | Source: Ambulatory Visit

## 2022-12-26 ENCOUNTER — Telehealth: Payer: Self-pay

## 2022-12-26 VITALS — BP 146/89 | HR 79 | Temp 98.1°F | Resp 19

## 2022-12-26 DIAGNOSIS — L710 Perioral dermatitis: Secondary | ICD-10-CM

## 2022-12-26 MED ORDER — DOXYCYCLINE HYCLATE 100 MG PO TABS: 100.0000 mg | ORAL_TABLET | Freq: Two times a day (BID) | ORAL | 0 refills | Status: AC

## 2022-12-26 MED ORDER — AZELEX 20 % EX CREA: TOPICAL_CREAM | Freq: Two times a day (BID) | CUTANEOUS | 0 refills | Status: DC

## 2022-12-26 MED ORDER — DEXAMETHASONE SODIUM PHOSPHATE 10 MG/ML IJ SOLN
10.0000 mg | INTRAMUSCULAR | Status: AC
  Administered 2022-12-26: 10 mg via INTRAMUSCULAR

## 2022-12-26 NOTE — ED Provider Notes (Signed)
[facility]    CSN: [id number] Arrival date & time: 12/26/22  1352      History   Chief Complaint Chief Complaint  Patient presents with   Rash    Entered by patient    HPI Katie Ingram is a 67 y.o. female.   The history is provided by the patient.   The patient presents for complaints of a rash around her mouth that has been present for the past 2 weeks. The patient states the rash was painful initially, with irritation, erythema and swelling. She states she has also had small bumps with a "head" that have come and gone. She states she started a pilocarpine prescribed by her rheumatologist 2 days prior. She states when she noticed the symptoms, she stopped the medication. She used Herpecin for the symptoms with minimal relief. She is wondering if this is impetigo.   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   Acute bacterial rhinosinusitis 05/08/2020   Chronic insomnia 11/04/2019   Systolic murmur 06/05/2017   Rhinitis, allergic 01/18/2016   Hypothyroidism 11/08/2012    Past Surgical History:  Procedure Laterality Date   COLONOSCOPY     COLONOSCOPY N/A 01/05/2019   Procedure: COLONOSCOPY;  Surgeon: Corbin Ade, MD;  Location: [facility];  Service: Endoscopy;  Laterality: N/A;  10:30   DILATION AND CURETTAGE OF UTERUS     ENDOMETRIAL ABLATION     ESOPHAGOGASTRODUODENOSCOPY     POLYPECTOMY  01/05/2019   Procedure: POLYPECTOMY;  Surgeon: Corbin Ade, MD;  Location: [facility];  Service: Endoscopy;;  splenic flexurex2   THYROID SURGERY     TONSILLECTOMY AND ADENOIDECTOMY     TUBAL LIGATION      OB History   No obstetric history on file.      Home Medications    Prior to Admission medications   Medication Sig Start  Date End Date Taking? Authorizing Provider  Bacillus Coagulans-Inulin (ALIGN PREBIOTIC-PROBIOTIC PO) Take 1 capsule by mouth daily. One daily   Yes [provider]  cetirizine (ZYRTEC) 10 MG tablet Take 10 mg by mouth daily.   Yes [provider]  fish oil-omega-3 fatty acids 1000 MG capsule Take 2 g by mouth daily.   Yes [provider]  fluticasone (FLONASE) 50 MCG/ACT nasal spray USE 2 SPRAYS IN EACH       NOSTRIL DAILY AS NEEDED Patient taking differently: Place 2 sprays into both nostrils 2 (two) times daily as needed for allergies. 10/01/15  Yes Merlyn Albert, MD  Glucosamine-Chondroitin (MOVE FREE PO) Take 1 tablet by mouth daily.   Yes [provider]  levothyroxine (SYNTHROID) 50 MCG tablet TAKE (1) TABLET BY MOUTH ONCE DAILY. 11/17/22  Yes Campbell Riches, NP  montelukast (SINGULAIR) 10 MG tablet TAKE (1) TABLET BY MOUTH ONCE DAILY. 11/17/22  Yes Campbell Riches, NP  Multiple Vitamin (MULTIVITAMIN) tablet Take 1 tablet by mouth daily.   Yes [provider]  Polyethyl Glycol-Propyl Glycol (SYSTANE ULTRA) 0.4-0.3 % SOLN Place 1 drop into both eyes 2 (two) times a day.   Yes [provider]  psyllium (METAMUCIL) 58.6 % powder Take 1 packet by mouth daily.   Yes [provider]  rosuvastatin (CRESTOR)  5 MG tablet TAKE (1) TABLET BY MOUTH DAILY ON MONDAY, WEDNESDAY AND FRIDAY. 11/17/22  Yes Campbell Riches, NP  ELDERBERRY PO Take 1 each by mouth daily.    [provider]  famotidine (PEPCID) 20 MG tablet Take 1 tablet (20 mg total) by mouth daily. 01/21/22 02/20/22  Jamont Mellin-Warren, Sadie Haber, NP  pilocarpine (SALAGEN) 5 MG tablet Take 1 tablet (5 mg total) by mouth 3 (three) times daily as needed. 12/10/22   Pollyann Savoy, MD    Family History Family History  Problem Relation Age of Onset   Hypertension Mother    Arthritis Mother    Endometrial cancer Mother    Hypertension Father    Hyperlipidemia Father     Hypertension Brother    Arthritis Maternal Grandmother    Cancer Other        colon   Colon cancer Other     Social History Social History   Tobacco Use   Smoking status: Never    Passive exposure: Never   Smokeless tobacco: Never  Vaping Use   Vaping status: Never Used  Substance Use Topics   Alcohol use: Yes    Alcohol/week: 0.0 standard drinks of alcohol    Comment: occas social   Drug use: No     Allergies   Bactrim [sulfamethoxazole-trimethoprim], Biaxin [clarithromycin], Ceftin [cefuroxime axetil], Compazine [prochlorperazine edisylate], Floxacillin [floxacillin (flucloxacillin)], Levaquin [levofloxacin in d5w], and Tobramycin   Review of Systems Review of Systems Per HPI  Physical Exam Triage Vital Signs ED Triage Vitals  Encounter Vitals Group     BP 12/26/22 1408 (!) 146/89     Systolic BP Percentile --      Diastolic BP Percentile --      Pulse Rate 12/26/22 1408 79     Resp 12/26/22 1408 19     Temp 12/26/22 1408 98.1 F (36.7 C)     Temp Source 12/26/22 1408 Oral     SpO2 12/26/22 1408 99 %     Weight --      Height --      Head Circumference --      Peak Flow --      Pain Score 12/26/22 1429 0     Pain Loc --      Pain Education --      Exclude from Growth Chart --    No data found.  Updated Vital Signs BP (!) 146/89 (BP Location: Right Arm)   Pulse 79   Temp 98.1 F (36.7 C) (Oral)   Resp 19   SpO2 99%   Visual Acuity Right Eye Distance:   Left Eye Distance:   Bilateral Distance:    Right Eye Near:   Left Eye Near:    Bilateral Near:     Physical Exam Vitals and nursing note reviewed.  Constitutional:      General: She is not in acute distress.    Appearance: Normal appearance.  HENT:     Head: Normocephalic.     Mouth/Throat:     Lips: Pink.     Mouth: Mucous membranes are moist. No oral lesions.     Tongue: No lesions.     Pharynx: Oropharynx is clear. Uvula midline. No pharyngeal swelling, oropharyngeal exudate or  posterior oropharyngeal erythema.  Eyes:     Extraocular Movements: Extraocular movements intact.     Pupils: Pupils are equal, round, and reactive to light.  Pulmonary:     Effort: Pulmonary effort is normal.  Musculoskeletal:  Cervical back: Normal range of motion.  Skin:    General: Skin is warm and dry.     Findings: Rash present.     Comments: Erythema noted to the upper and lower lips. Small pinpoint papules noted along the chin and upper lip.   Neurological:     General: No focal deficit present.     Mental Status: She is alert and oriented to person, place, and time.  Psychiatric:        Mood and Affect: Mood normal.        Behavior: Behavior normal.      UC Treatments / Results  Labs (all labs ordered are listed, but only abnormal results are displayed) Labs Reviewed - No data to display  EKG   Radiology No results found.  Procedures Procedures (including critical care time)  Medications Ordered in UC Medications - No data to display  Initial Impression / Assessment and Plan / UC Course  I have reviewed the triage vital signs and the nursing notes.  Pertinent labs & imaging results that were available during my care of the patient were reviewed by me and considered in my medical decision making (see chart for details).  The patient is well-appearing, she is in no acute distress, vital signs are stable.  Suspect perioral dermatitis. Decadron 10mg  IM given for inflammation. Will treat with azelaic acid 20% cream and doxycycline 100mg . Supportive care recommendations were provided and discussed with the patient to include avoiding irritants such as make up or sunscreen, washing the face with a warm water and a fragrance free cleanser and avoiding steroid creams. Patient advised to follow-up if symptoms are not improving or other concerns. Patient is in agreement with this plan of care and verbalizes understanding, all questions were answered. The patient is  stable for discharge.     Final Clinical Impressions(s) / UC Diagnoses   Final diagnoses:  None   Discharge Instructions   None    ED Prescriptions   None    PDMP not reviewed this encounter.   Abran Cantor, NP 12/26/22 1528

## 2022-12-26 NOTE — Discharge Instructions (Addendum)
Take medication as prescribed. Avoid harsh face scrubs or perfumed cleansers. During flare-ups wash your face with warm water, and once healed, use mild soap and avoid scrubbing. Avoid steroid creams, even nonprescription hydrocortisone. Avoid cosmetics. Frequently wash your pillowcases and towels in hot water. Limit overly salty or spicy foods, which may irritate the skin around the mouth. Follow-up in this clinic or with your PCP if symptoms do not improve.

## 2022-12-26 NOTE — ED Triage Notes (Signed)
Pt presents with bumps/small blisters to face around mouth that are itchy that started 2 weeks ago. Pt thinks it may be impetigo.

## 2022-12-26 NOTE — Telephone Encounter (Signed)
Pt called to inform us Azelex cream. Is not sold from [facility] does not carry. The medication.

## 2022-12-26 NOTE — Telephone Encounter (Signed)
[facility] were unable to fill azalax cream, called [facility] and they have azalex 15% gel. Sent in order and told pt.

## 2023-01-01 NOTE — Progress Notes (Addendum)
 Office Visit Note  Patient: Katie Ingram             Date of Birth: May 16, 1956           MRN: [id number]             PCP: Cook, Jayce G, DO Referring: Cook, Jayce G, DO Visit Date: 01/15/2023 Occupation: @GUAROCC @  Subjective:  Dry mouth and dry eyes  History of Present Illness: Katie Ingram is a 67 y.o. female with sicca symptoms.  Patient states she tried pilocarpine  and some new toothpaste.  She developed swelling on her lips.  She was uncertain what caused the swelling and a stop pilocarpine .  She discontinued using the toothpaste.  Her symptoms are gradually getting better.  She continues to have dry mouth and dry eye symptoms which are manageable.  She continues to have stiffness in her hands due to osteoarthritis.  She has off-and-on discomfort in her knee joints.  She has been walking every day about 10,000 miles without any difficulty.  She denies any history of joint swelling.  There is no history of shortness of breath.    Activities of Daily Living:  Patient reports morning stiffness for 5-10 minutes.   Patient Reports nocturnal pain.  Difficulty dressing/grooming: Denies Difficulty climbing stairs: Denies Difficulty getting out of chair: Denies Difficulty using hands for taps, buttons, cutlery, and/or writing: Denies  Review of Systems  Constitutional:  Positive for fatigue.  HENT:  Positive for mouth dryness. Negative for mouth sores.   Eyes:  Positive for dryness.  Respiratory:  Negative for shortness of breath.   Cardiovascular:  Negative for chest pain and palpitations.  Gastrointestinal:  Negative for blood in stool, constipation and diarrhea.  Endocrine: Negative for increased urination.  Genitourinary:  Negative for involuntary urination.  Musculoskeletal:  Positive for joint pain, joint pain, joint swelling and morning stiffness. Negative for gait problem, myalgias, muscle weakness, muscle tenderness and myalgias.  Skin:  Negative for color change, rash,  hair loss and sensitivity to sunlight.  Allergic/Immunologic: Negative for susceptible to infections.  Neurological:  Negative for dizziness and headaches.  Hematological:  Negative for swollen glands.  Psychiatric/Behavioral:  Negative for depressed mood and sleep disturbance. The patient is not nervous/anxious.     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   Acute bacterial rhinosinusitis 05/08/2020   Chronic insomnia 11/04/2019   Systolic murmur 06/05/2017   Rhinitis, allergic 01/18/2016   Hypothyroidism 11/08/2012    Past Medical History:  Diagnosis Date   Allergic rhinitis    H/O goiter    Hypothyroidism    Seasonal allergies     Family History  Problem Relation Age of Onset   Hypertension Mother    Arthritis Mother    Endometrial cancer Mother    Hypertension Father    Hyperlipidemia Father    Hypertension Brother    Arthritis Maternal Grandmother    Cancer Other        colon   Colon cancer Other    Past Surgical History:  Procedure Laterality Date   COLONOSCOPY     COLONOSCOPY N/A 01/05/2019   Procedure: COLONOSCOPY;  Surgeon: Shaaron Lamar HERO, MD;  Location: [facility];  Service: Endoscopy;  Laterality: N/A;  10:30   DILATION AND CURETTAGE OF UTERUS     ENDOMETRIAL ABLATION     ESOPHAGOGASTRODUODENOSCOPY     POLYPECTOMY  01/05/2019   Procedure: POLYPECTOMY;  Surgeon: Shaaron Lamar HERO, MD;  Location: [facility];  Service: Endoscopy;;  splenic flexurex2   THYROID SURGERY     TONSILLECTOMY AND ADENOIDECTOMY     TUBAL LIGATION     Social History   Social History Narrative   Not on file   Immunization History  Administered Date(s) Administered   Influenza,inj,Quad PF,6+ Mos 03/23/2020   Influenza-Unspecified 04/23/2021   Moderna Sars-Covid-2 Vaccination 07/21/2019, 08/18/2019, 04/22/2020   PNEUMOCOCCAL CONJUGATE-20 11/17/2022   Tdap  07/23/2018   Zoster Recombinant(Shingrix) 07/31/2017, 11/12/2017   Zoster, Live 09/18/2011     Objective: Vital Signs: BP (!) 158/94 (BP Location: Right Arm, Patient Position: Sitting, Cuff Size: Normal)   Pulse 76   Resp 14   Ht 5' 6 (1.676 m)   Wt 163 lb (73.9 kg)   BMI 26.31 kg/m    Physical Exam Vitals and nursing note reviewed.  Constitutional:      Appearance: She is well-developed.  HENT:     Head: Normocephalic and atraumatic.  Eyes:     Conjunctiva/sclera: Conjunctivae normal.     Comments: Bilateral pterygium was noted.  Cardiovascular:     Rate and Rhythm: Normal rate and regular rhythm.     Heart sounds: Normal heart sounds.  Pulmonary:     Effort: Pulmonary effort is normal.     Breath sounds: Normal breath sounds.  Abdominal:     General: Bowel sounds are normal.     Palpations: Abdomen is soft.  Musculoskeletal:     Cervical back: Normal range of motion.  Lymphadenopathy:     Cervical: No cervical adenopathy.  Skin:    General: Skin is warm and dry.     Capillary Refill: Capillary refill takes less than 2 seconds.  Neurological:     Mental Status: She is alert and oriented to person, place, and time.  Psychiatric:        Behavior: Behavior normal.      Musculoskeletal Exam: Cervical, thoracic and lumbar spine 1 good range of motion.  Shoulder joints, elbow joints, wrist joints, MCPs PIPs and DIPs with good range of motion.  She had bilateral CMC PIP and DIP thickening consistent with osteoarthritis.  No synovitis was noted.  Hip joints and knee joints in good range of motion without any warmth swelling or effusion.  There was no tenderness over ankles or MTPs.  CDAI Exam: CDAI Score: -- Patient Global: --; Provider Global: -- Swollen: --; Tender: -- Joint Exam 01/15/2023   No joint exam has been documented for this visit   There is currently no information documented on the homunculus. Go to the Rheumatology activity and complete the homunculus  joint exam.  Investigation: No additional findings.  Imaging: No results found.  Recent Labs: Lab Results  Component Value Date   WBC 4.7 11/10/2022   HGB 14.6 11/10/2022   PLT 207 11/10/2022   NA 141 11/10/2022   K 4.0 11/10/2022   CL 102 11/10/2022   CO2 24 11/10/2022   GLUCOSE 104 (H) 11/10/2022   BUN 13 11/10/2022   CREATININE 0.75 11/10/2022   BILITOT 0.7 11/10/2022   ALKPHOS 46 11/10/2022   AST 22 11/10/2022   ALT 26 11/10/2022   PROT 6.4 11/10/2022   ALBUMIN 4.5 11/10/2022   CALCIUM  9.1 11/10/2022   GFRAA 86 10/26/2019     Speciality Comments: No specialty comments available.  Procedures:  No procedures performed Allergies: Bactrim [sulfamethoxazole-trimethoprim], Biaxin [clarithromycin], Ceftin [cefuroxime axetil], Compazine [  prochlorperazine edisylate], Floxacillin [floxacillin (flucloxacillin)], Levaquin  [levofloxacin  in d5w], and Tobramycin   Assessment / Plan:     Visit Diagnoses: Sicca syndrome (HCC) - Positive ANA, SSA negative, SSB negative, dry mouth, dry eyes. December 10, 2022 ANA 1: 80NH, ENA (RNP, [place], [place], SSB, dsDNA) negative, C3-C4 normal, ESR 2, RF negative.  Lab results were discussed with the patient at length.  Patient was started on pilocarpine  5 mg 3 times daily at the last visit on December 10, 2022.  Patient states she tried pilocarpine  and a new toothpaste.  She developed swelling on her lips.  She is uncertain what caused the swelling but she stopped pilocarpine .  She states her symptoms are not bad enough to restart pilocarpine  at this point.  She will like to try over-the-counter products.  Over-the-counter products were discussed at length.  She denies any shortness of breath.  Increased risk of ILD and lymphoma with Sjogren's was discussed.  At this point she does not meet the criteria for Sjogren's.  Primary osteoarthritis of both hands -she had bilateral CMC PIP and DIP thickening.  Patient has been evaluated by Dr. Camella in the past.  Patient  states that [facility] surgery was advised.  Use of CMC brace was discussed at the last visit.  Joint protection muscle strengthening was discussed.  Patient was given a handout on hand exercises at the last visit.  Use of arthritis gloves was discussed.  Chronic pain of both knees -use of knee joint brace was discussed.  No warmth swelling or effusion was noted.  She has been walking 10,000 steps daily.  Pain in the joints since she was a teenager.  She played tennis in high school.  Other medical problems are listed as follows:  Systolic murmur  History of gastroesophageal reflux (GERD)  Seasonal allergic rhinitis due to other allergic trigger - She is on Singulair  10 mg p.o. daily.  Other specified hypothyroidism  Other hyperlipidemia  Chronic insomnia  Osteoporosis screening - DEXA scan normal on November 19, 2022.  Orders: No orders of the defined types were placed in this encounter.  No orders of the defined types were placed in this encounter.     Follow-Up Instructions: Return in about 1 year (around 01/15/2024) for sicca , Osteoarthritis.   Maya Nash, MD  Note - This record has been created using Animal nutritionist.  Chart creation errors have been sought, but may not always  have been located. Such creation errors do not reflect on  the standard of medical care.

## 2023-01-15 ENCOUNTER — Ambulatory Visit: Payer: Medicare HMO | Attending: Rheumatology | Admitting: Rheumatology

## 2023-01-15 ENCOUNTER — Encounter: Payer: Self-pay | Admitting: Rheumatology

## 2023-01-15 VITALS — BP 158/94 | HR 76 | Resp 14 | Ht 66.0 in | Wt 163.0 lb

## 2023-01-15 DIAGNOSIS — Z13820 Encounter for screening for osteoporosis: Secondary | ICD-10-CM | POA: Diagnosis not present

## 2023-01-15 DIAGNOSIS — M25561 Pain in right knee: Secondary | ICD-10-CM

## 2023-01-15 DIAGNOSIS — M25562 Pain in left knee: Secondary | ICD-10-CM | POA: Diagnosis not present

## 2023-01-15 DIAGNOSIS — M19042 Primary osteoarthritis, left hand: Secondary | ICD-10-CM | POA: Diagnosis not present

## 2023-01-15 DIAGNOSIS — M3500 Sicca syndrome, unspecified: Secondary | ICD-10-CM

## 2023-01-15 DIAGNOSIS — M19041 Primary osteoarthritis, right hand: Secondary | ICD-10-CM

## 2023-01-15 DIAGNOSIS — E7849 Other hyperlipidemia: Secondary | ICD-10-CM | POA: Diagnosis not present

## 2023-01-15 DIAGNOSIS — Z8719 Personal history of other diseases of the digestive system: Secondary | ICD-10-CM | POA: Diagnosis not present

## 2023-01-15 DIAGNOSIS — G8929 Other chronic pain: Secondary | ICD-10-CM

## 2023-01-15 DIAGNOSIS — R011 Cardiac murmur, unspecified: Secondary | ICD-10-CM

## 2023-01-15 DIAGNOSIS — J3089 Other allergic rhinitis: Secondary | ICD-10-CM | POA: Diagnosis not present

## 2023-01-15 DIAGNOSIS — E038 Other specified hypothyroidism: Secondary | ICD-10-CM | POA: Diagnosis not present

## 2023-01-15 DIAGNOSIS — F5104 Psychophysiologic insomnia: Secondary | ICD-10-CM | POA: Diagnosis not present

## 2023-02-25 DIAGNOSIS — Z1283 Encounter for screening for malignant neoplasm of skin: Secondary | ICD-10-CM | POA: Diagnosis not present

## 2023-02-25 DIAGNOSIS — L905 Scar conditions and fibrosis of skin: Secondary | ICD-10-CM | POA: Diagnosis not present

## 2023-02-25 DIAGNOSIS — D225 Melanocytic nevi of trunk: Secondary | ICD-10-CM | POA: Diagnosis not present

## 2023-02-25 DIAGNOSIS — K130 Diseases of lips: Secondary | ICD-10-CM | POA: Diagnosis not present

## 2023-03-23 ENCOUNTER — Other Ambulatory Visit: Payer: Self-pay | Admitting: Nurse Practitioner

## 2023-04-10 ENCOUNTER — Encounter: Payer: Medicare HMO | Admitting: Rheumatology

## 2023-04-30 ENCOUNTER — Ambulatory Visit: Payer: Medicare HMO | Admitting: Rheumatology

## 2023-07-01 DIAGNOSIS — L728 Other follicular cysts of the skin and subcutaneous tissue: Secondary | ICD-10-CM | POA: Diagnosis not present

## 2023-07-01 DIAGNOSIS — L820 Inflamed seborrheic keratosis: Secondary | ICD-10-CM | POA: Diagnosis not present

## 2023-07-01 DIAGNOSIS — L570 Actinic keratosis: Secondary | ICD-10-CM | POA: Diagnosis not present

## 2023-07-01 DIAGNOSIS — X32XXXD Exposure to sunlight, subsequent encounter: Secondary | ICD-10-CM | POA: Diagnosis not present

## 2023-07-09 ENCOUNTER — Encounter: Payer: Self-pay | Admitting: Nurse Practitioner

## 2023-07-09 ENCOUNTER — Ambulatory Visit (INDEPENDENT_AMBULATORY_CARE_PROVIDER_SITE_OTHER): Payer: Medicare HMO | Admitting: Nurse Practitioner

## 2023-07-09 VITALS — BP 162/88 | HR 79 | Temp 98.2°F | Ht 66.0 in | Wt 166.6 lb

## 2023-07-09 DIAGNOSIS — R030 Elevated blood-pressure reading, without diagnosis of hypertension: Secondary | ICD-10-CM | POA: Insufficient documentation

## 2023-07-09 DIAGNOSIS — Z0001 Encounter for general adult medical examination with abnormal findings: Secondary | ICD-10-CM | POA: Diagnosis not present

## 2023-07-09 DIAGNOSIS — Z0000 Encounter for general adult medical examination without abnormal findings: Secondary | ICD-10-CM

## 2023-07-09 HISTORY — DX: Elevated blood-pressure reading, without diagnosis of hypertension: R03.0

## 2023-07-09 NOTE — Patient Instructions (Signed)
Recommend COVID and flu vaccines.

## 2023-07-09 NOTE — Progress Notes (Signed)
Subjective:    Katie Ingram is a 68 y.o. female who presents for a Welcome to Medicare exam.          Objective:    Today's Vitals   07/09/23 0944  BP: (!) 158/90  Pulse: 79  Temp: 98.2 F (36.8 C)  SpO2: 98%  Weight: 166 lb 9.6 oz (75.6 kg)  Height: 5\' 6"  (1.676 m)  Body mass index is 26.89 kg/m. BP on recheck 162/88.  Medications Outpatient Encounter Medications as of 07/09/2023  Medication Sig   Bacillus Coagulans-Inulin (ALIGN PREBIOTIC-PROBIOTIC PO) Take 1 capsule by mouth daily. One daily   cetirizine (ZYRTEC) 10 MG tablet Take 10 mg by mouth daily.   ELDERBERRY PO Take 1 each by mouth daily.   famotidine (PEPCID) 20 MG tablet Take 1 tablet (20 mg total) by mouth daily.   fish oil-omega-3 fatty acids 1000 MG capsule Take 2 g by mouth daily.   fluticasone (FLONASE) 50 MCG/ACT nasal spray USE 2 SPRAYS IN EACH       NOSTRIL DAILY AS NEEDED (Patient taking differently: Place 2 sprays into both nostrils 2 (two) times daily as needed for allergies.)   Glucosamine-Chondroitin (MOVE FREE PO) Take 1 tablet by mouth daily.   levothyroxine (SYNTHROID) 50 MCG tablet TAKE (1) TABLET BY MOUTH ONCE DAILY.   montelukast (SINGULAIR) 10 MG tablet TAKE (1) TABLET BY MOUTH ONCE DAILY.   Multiple Vitamin (MULTIVITAMIN) tablet Take 1 tablet by mouth daily.   Polyethyl Glycol-Propyl Glycol (SYSTANE ULTRA) 0.4-0.3 % SOLN Place 1 drop into both eyes 2 (two) times a day.   psyllium (METAMUCIL) 58.6 % powder Take 1 packet by mouth daily.   rosuvastatin (CRESTOR) 5 MG tablet TAKE (1) TABLET BY MOUTH DAILY ON MONDAY, WEDNESDAY AND FRIDAY.   No facility-administered encounter medications on file as of 07/09/2023.     History: Past Medical History:  Diagnosis Date   Allergic rhinitis    H/O goiter    Hypothyroidism    Seasonal allergies    Past Surgical History:  Procedure Laterality Date   COLONOSCOPY     COLONOSCOPY N/A 01/05/2019   Procedure: COLONOSCOPY;  Surgeon: Corbin Ade,  MD;  Location: [facility];  Service: Endoscopy;  Laterality: N/A;  10:30   DILATION AND CURETTAGE OF UTERUS     ENDOMETRIAL ABLATION     ESOPHAGOGASTRODUODENOSCOPY     POLYPECTOMY  01/05/2019   Procedure: POLYPECTOMY;  Surgeon: Corbin Ade, MD;  Location: [facility];  Service: Endoscopy;;  splenic flexurex2   THYROID SURGERY     TONSILLECTOMY AND ADENOIDECTOMY     TUBAL LIGATION      Family History  Problem Relation Age of Onset   Hypertension Mother    Arthritis Mother    Endometrial cancer Mother    Hypertension Father    Hyperlipidemia Father    Hypertension Brother    Arthritis Maternal Grandmother    Cancer Other        colon   Colon cancer Other    Social History   Occupational History   Not on file  Tobacco Use   Smoking status: Never    Passive exposure: Never   Smokeless tobacco: Never  Vaping Use   Vaping status: Never Used  Substance and Sexual Activity   Alcohol use: Yes    Alcohol/week: 0.0 standard drinks of alcohol    Comment: occas social   Drug use: No   Sexual activity: Yes    Birth control/protection:  Surgical, Post-menopausal      Immunizations and Health Maintenance Immunization History  Administered Date(s) Administered   Influenza,inj,Quad PF,6+ Mos 03/23/2020   Influenza-Unspecified 04/23/2021   Moderna Sars-Covid-2 Vaccination 07/21/2019, 08/18/2019, 04/22/2020   PNEUMOCOCCAL CONJUGATE-20 11/17/2022   Tdap 07/23/2018   Zoster Recombinant(Shingrix) 07/31/2017, 11/12/2017   Zoster, Live 09/18/2011   There are no preventive care reminders to display for this patient.  Activities of Daily Living    07/05/2023    1:24 PM  In your present state of health, do you have any difficulty performing the following activities:  Hearing? 0  Vision? 0  Difficulty concentrating or making decisions? 0  Walking or climbing stairs? 0  Dressing or bathing? 0  Doing errands, shopping? 0  Preparing Food and eating ? N  Using the Toilet? N   In the past six months, have you accidently leaked urine? N  Do you have problems with loss of bowel control? N  Managing your Medications? N  Managing your Finances? N  Housekeeping or managing your Housekeeping? N    Physical Exam   NAD. Alert, oriented. Lungs clear. Heart RRR.    Advanced Directives: Patient has ACP in place       Assessment:    This is a routine wellness examination for this patient .     Depression Screen    07/09/2023    9:52 AM 11/17/2022    8:36 AM 11/15/2021    8:14 AM 11/09/2020    8:29 AM  PHQ 2/9 Scores  PHQ - 2 Score 0 0 0 0  PHQ- 9 Score 0 3         07/09/2023    9:52 AM 11/17/2022    8:38 AM  GAD 7 : Generalized Anxiety Score  Nervous, Anxious, on Edge 0 0  Control/stop worrying 0 0  Worry too much - different things 0 0  Trouble relaxing 0 0  Restless 0 0  Easily annoyed or irritable 0 0  Afraid - awful might happen 0 0  Total GAD 7 Score 0 0  Anxiety Difficulty  Not difficult at all      Fall Risk    07/09/2023    9:52 AM  Fall Risk   Falls in the past year? 0    Cognitive Function:    Mini-Cog - 07/09/23 0952     Normal clock drawing test? yes    How many words correct? 2                  Patient Care Team: Tommie Sams, DO as PCP - General (Family Medicine) [place] Bali, MD (Inactive) as Consulting Physician (Gastroenterology)     Plan:    Problem List Items Addressed This Visit       Other   Elevated BP without diagnosis of hypertension   Other Visit Diagnoses       Initial Medicare annual wellness visit    -  Primary      Patient to check BP outside of the office and notify us if remains above 140/90. Look at stress reduction. Very active with walking and counts steps daily. Watch sodium in diet.   I have personally reviewed and noted the following in the patient's chart:   Medical and social history Use of alcohol, tobacco or illicit drugs  Current medications and  supplements Functional ability and status Nutritional status Physical activity Advanced directives List of other physicians Hospitalizations, surgeries, and ER visits in previous 12  months Vitals Screenings to include cognitive, depression, and falls Referrals and appointments  In addition, I have reviewed and discussed with patient certain preventive protocols, quality metrics, and best practice recommendations. A written personalized care plan for preventive services as well as general preventive health recommendations were provided to patient.     Campbell Riches, NP 07/09/2023

## 2023-07-15 DIAGNOSIS — L820 Inflamed seborrheic keratosis: Secondary | ICD-10-CM | POA: Diagnosis not present

## 2023-09-09 DIAGNOSIS — D225 Melanocytic nevi of trunk: Secondary | ICD-10-CM | POA: Diagnosis not present

## 2023-09-09 DIAGNOSIS — Z1283 Encounter for screening for malignant neoplasm of skin: Secondary | ICD-10-CM | POA: Diagnosis not present

## 2023-09-14 ENCOUNTER — Telehealth: Payer: Self-pay | Admitting: Family Medicine

## 2023-09-14 NOTE — Telephone Encounter (Signed)
 Copied from CRM [id number]. Topic: Referral - Request for Referral >> Sep 14, 2023  1:11 PM [place] B wrote: Did the patient discuss referral with their provider in the last year? yes    Type of order/referral and detailed reason for visit: colonoscoopy/recheck for polyps  Preference of office, provider, location: ?  If referral order, have you been seen by this specialty before? yes (If Yes, this issue or another issue? When? Where? Same issue 5 years ago  Can we respond through MyChart? yes

## 2023-09-15 ENCOUNTER — Other Ambulatory Visit: Payer: Self-pay | Admitting: Nurse Practitioner

## 2023-09-15 DIAGNOSIS — Z1211 Encounter for screening for malignant neoplasm of colon: Secondary | ICD-10-CM

## 2023-09-15 NOTE — Telephone Encounter (Signed)
 Referral placed.

## 2023-09-24 ENCOUNTER — Encounter: Payer: Self-pay | Admitting: *Deleted

## 2023-11-05 ENCOUNTER — Other Ambulatory Visit: Payer: Self-pay | Admitting: Nurse Practitioner

## 2023-11-05 DIAGNOSIS — J309 Allergic rhinitis, unspecified: Secondary | ICD-10-CM

## 2023-11-05 DIAGNOSIS — E039 Hypothyroidism, unspecified: Secondary | ICD-10-CM

## 2023-11-23 ENCOUNTER — Encounter: Payer: Self-pay | Admitting: Nurse Practitioner

## 2023-11-23 ENCOUNTER — Telehealth: Payer: Self-pay | Admitting: *Deleted

## 2023-11-23 NOTE — Telephone Encounter (Signed)
 Copied from CRM [id number]. Topic: Clinical - Medication Question >> Nov 23, 2023  8:36 AM Emmet Harm C wrote: Reason for CRM: Patient has heard anything about her Lab work that is noramlly called in prior to her appt, patient would like a call concerning this

## 2023-11-24 NOTE — Telephone Encounter (Signed)
 Copied from CRM [id number]. Topic: Clinical - Request for Lab/Test Order >> Nov 24, 2023  9:16 AM Juluis Ok wrote: Reason for CRM: Patient requesting to have labs completed before her physical.

## 2023-11-25 ENCOUNTER — Encounter: Payer: Self-pay | Admitting: Nurse Practitioner

## 2023-11-25 ENCOUNTER — Other Ambulatory Visit: Payer: Self-pay | Admitting: Family Medicine

## 2023-11-25 DIAGNOSIS — Z130 Encounter for screening for diseases of the blood and blood-forming organs and certain disorders involving the immune mechanism: Secondary | ICD-10-CM

## 2023-11-25 DIAGNOSIS — R030 Elevated blood-pressure reading, without diagnosis of hypertension: Secondary | ICD-10-CM

## 2023-11-25 DIAGNOSIS — E038 Other specified hypothyroidism: Secondary | ICD-10-CM

## 2023-11-25 DIAGNOSIS — E7849 Other hyperlipidemia: Secondary | ICD-10-CM

## 2023-11-26 ENCOUNTER — Encounter: Payer: Self-pay | Admitting: Nurse Practitioner

## 2023-11-26 ENCOUNTER — Other Ambulatory Visit (HOSPITAL_COMMUNITY): Payer: Self-pay | Admitting: Nurse Practitioner

## 2023-11-26 ENCOUNTER — Ambulatory Visit: Payer: Medicare HMO | Admitting: Nurse Practitioner

## 2023-11-26 VITALS — BP 146/78 | HR 86 | Temp 98.2°F | Ht 66.0 in | Wt 160.0 lb

## 2023-11-26 DIAGNOSIS — J309 Allergic rhinitis, unspecified: Secondary | ICD-10-CM

## 2023-11-26 DIAGNOSIS — E039 Hypothyroidism, unspecified: Secondary | ICD-10-CM

## 2023-11-26 DIAGNOSIS — Z01411 Encounter for gynecological examination (general) (routine) with abnormal findings: Secondary | ICD-10-CM

## 2023-11-26 DIAGNOSIS — Z1231 Encounter for screening mammogram for malignant neoplasm of breast: Secondary | ICD-10-CM

## 2023-11-26 DIAGNOSIS — Z01419 Encounter for gynecological examination (general) (routine) without abnormal findings: Secondary | ICD-10-CM

## 2023-11-26 DIAGNOSIS — I10 Essential (primary) hypertension: Secondary | ICD-10-CM

## 2023-11-26 MED ORDER — LEVOTHYROXINE SODIUM 50 MCG PO TABS
ORAL_TABLET | ORAL | 3 refills | Status: AC
Start: 2023-11-26 — End: ?

## 2023-11-26 MED ORDER — MONTELUKAST SODIUM 10 MG PO TABS
ORAL_TABLET | ORAL | 3 refills | Status: AC
Start: 2023-11-26 — End: ?

## 2023-11-26 MED ORDER — ROSUVASTATIN CALCIUM 5 MG PO TABS: ORAL_TABLET | ORAL | 3 refills | Status: AC

## 2023-11-27 ENCOUNTER — Encounter: Payer: Self-pay | Admitting: Nurse Practitioner

## 2023-11-27 DIAGNOSIS — R030 Elevated blood-pressure reading, without diagnosis of hypertension: Secondary | ICD-10-CM | POA: Diagnosis not present

## 2023-11-27 DIAGNOSIS — I10 Essential (primary) hypertension: Secondary | ICD-10-CM | POA: Insufficient documentation

## 2023-11-27 DIAGNOSIS — E038 Other specified hypothyroidism: Secondary | ICD-10-CM | POA: Diagnosis not present

## 2023-11-27 DIAGNOSIS — E7849 Other hyperlipidemia: Secondary | ICD-10-CM | POA: Diagnosis not present

## 2023-11-27 DIAGNOSIS — Z130 Encounter for screening for diseases of the blood and blood-forming organs and certain disorders involving the immune mechanism: Secondary | ICD-10-CM | POA: Diagnosis not present

## 2023-11-27 MED ORDER — OMRON 7 SERIES BP MONITOR DEVI: 0 refills | Status: DC

## 2023-11-27 MED ORDER — HYDROCHLOROTHIAZIDE 25 MG PO TABS: 25.0000 mg | ORAL_TABLET | Freq: Every day | ORAL | 0 refills | Status: DC

## 2023-11-27 NOTE — Progress Notes (Signed)
 Subjective:    Patient ID: Katie Ingram, female    DOB: 01/02/1956, 68 y.o.   MRN: [id number]  HPI The patient comes in today for a wellness visit.    A review of their health history was completed.  A review of medications was also completed.  Any needed refills; yes  Eating habits: excellent  Falls/  MVA accidents in past few months: none  Regular exercise: walks at least 10,000 steps per day; weight training for arms and legs  Specialist pt sees on regular basis: rheumatology and dermatology  Preventative health issues were discussed.   Additional concerns:  Married, same sexual partner. No vaginal bleeding. Had polyps on her last colonoscopy. Will get another done later this year. Mammogram scheduled for 6/30. Regular vision and dental exams.  Does not check her BP outside of the office. Her husband has a wrist cuff. Has been taking magnesium for about a week which has helped her sleep. Plans to get yearly labs soon.   Review of Systems  Constitutional:  Negative for activity change, appetite change and fatigue.  HENT:  Negative for sore throat and trouble swallowing.   Respiratory:  Negative for cough, chest tightness, shortness of breath and wheezing.   Cardiovascular:  Positive for leg swelling. Negative for chest pain.       Occasional mild swelling in the lower legs especially with hot weather.  Gastrointestinal:  Negative for abdominal distention, abdominal pain, constipation, diarrhea, nausea and vomiting.  Genitourinary:  Negative for difficulty urinating, dysuria, frequency, genital sores, pelvic pain, urgency, vaginal bleeding and vaginal discharge.       Mild occasional urinary leakage with cough or sneezing. Denies vulvar itching, rash or irritation.       11/26/2023    9:37 AM  Depression screen PHQ 2/9  Decreased Interest 0  Down, Depressed, Hopeless 0  PHQ - 2 Score 0  Altered sleeping 2  Tired, decreased energy 0  Change in appetite 0   Feeling bad or failure about yourself  0  Trouble concentrating 0  Moving slowly or fidgety/restless 0  Suicidal thoughts 0  PHQ-9 Score 2      11/26/2023    9:37 AM 07/09/2023    9:52 AM 11/17/2022    8:38 AM  GAD 7 : Generalized Anxiety Score  Nervous, Anxious, on Edge 0 0 0  Control/stop worrying 0 0 0  Worry too much - different things 0 0 0  Trouble relaxing 0 0 0  Restless 0 0 0  Easily annoyed or irritable 0 0 0  Afraid - awful might happen 0 0 0  Total GAD 7 Score 0 0 0  Anxiety Difficulty   Not difficult at all    Social History   Tobacco Use   Smoking status: Never    Passive exposure: Never   Smokeless tobacco: Never  Vaping Use   Vaping status: Never Used  Substance Use Topics   Alcohol use: Yes    Alcohol/week: 0.0 standard drinks of alcohol    Comment: occas social   Drug use: No        Objective:   Physical Exam Vitals and nursing note reviewed. Chaperone present: Defers chaperone.  Constitutional:      General: She is not in acute distress.    Appearance: She is well-developed.  Neck:     Thyroid: No thyromegaly.     Trachea: No tracheal deviation.     Comments: Thyroid non tender to palpation. No mass  or goiter noted.  Cardiovascular:     Rate and Rhythm: Normal rate and regular rhythm.     Heart sounds: Murmur heard.     Comments: Grade 2/6 soft early systolic murmur noted.  Note that this is a chronic murmur. Pulmonary:     Effort: Pulmonary effort is normal.     Breath sounds: Normal breath sounds.  Chest:  Breasts:    Right: No swelling, inverted nipple, mass, skin change or tenderness.     Left: No swelling, inverted nipple, mass, skin change or tenderness.  Abdominal:     General: There is no distension.     Palpations: Abdomen is soft.     Tenderness: There is no abdominal tenderness.  Genitourinary:    Comments: Defers GU exam.  Denies any problems.  Normal Pap with negative HPV 2021.  Musculoskeletal:     Cervical back:  Normal range of motion and neck supple.     Right lower leg: No edema.     Left lower leg: No edema.  Lymphadenopathy:     Cervical: No cervical adenopathy.     Upper Body:     Right upper body: No supraclavicular, axillary or pectoral adenopathy.     Left upper body: No supraclavicular, axillary or pectoral adenopathy.   Skin:    General: Skin is warm and dry.   Neurological:     Mental Status: She is alert and oriented to person, place, and time.   Psychiatric:        Mood and Affect: Mood normal.        Behavior: Behavior normal.        Thought Content: Thought content normal.        Judgment: Judgment normal.    Today's Vitals   11/26/23 0932  BP: (!) 146/78  Pulse: 86  Temp: 98.2 F (36.8 C)  SpO2: 100%  Weight: 160 lb (72.6 kg)  Height: 5' 6 (1.676 m)   Body mass index is 25.82 kg/m.         Assessment & Plan:   Problem List Items Addressed This Visit       Cardiovascular and Mediastinum   Primary hypertension   Relevant Medications   rosuvastatin  (CRESTOR ) 5 MG tablet   hydrochlorothiazide (HYDRODIURIL) 25 MG tablet     Respiratory   Rhinitis, allergic   Relevant Medications   montelukast  (SINGULAIR ) 10 MG tablet     Endocrine   Hypothyroidism   Relevant Medications   levothyroxine  (SYNTHROID ) 50 MCG tablet   Other Visit Diagnoses       Well woman exam    -  Primary      Meds ordered this encounter  Medications   levothyroxine  (SYNTHROID ) 50 MCG tablet    Sig: TAKE (1) TABLET BY MOUTH ONCE DAILY.    Dispense:  90 tablet    Refill:  3    This prescription was filled on 08/17/2023. Any refills authorized will be placed on file.   montelukast  (SINGULAIR ) 10 MG tablet    Sig: TAKE (1) TABLET BY MOUTH ONCE DAILY.    Dispense:  90 tablet    Refill:  3    This prescription was filled on 08/17/2023. Any refills authorized will be placed on file.   rosuvastatin  (CRESTOR ) 5 MG tablet    Sig: TAKE (1) TABLET BY MOUTH DAILY ON MONDAY,  WEDNESDAY AND FRIDAY.    Dispense:  36 tablet    Refill:  3   Blood Pressure Monitoring (  OMRON 7 SERIES BP MONITOR) DEVI    Sig: Use as directed to monitor home BP readings.    Dispense:  1 each    Refill:  0    Diagnosis: Primary HTN I10    Supervising Provider:   Charlotta Cook A [[id number]]   hydrochlorothiazide (HYDRODIURIL) 25 MG tablet    Sig: Take 1 tablet (25 mg total) by mouth daily.    Dispense:  90 tablet    Refill:  0    Supervising Provider:   Charlotta Cook A [[id number]]    Continue healthy lifestyle habits. Continue current medication regimen. Obtain yearly labs as planned. Start HCTZ 25 mg daily for blood pressure and mild edema. Prescription sent in for a blood pressure monitor for patient.  Recommend she check her BP several times a week at different times of the day and to contact office if remains 140/90 or above.  Further follow-up based on BP results. Return in about 1 year (around 11/25/2024) for physical.

## 2023-11-28 LAB — LIPID PANEL
Chol/HDL Ratio: 3.1 ratio (ref 0.0–4.4)
Cholesterol, Total: 166 mg/dL (ref 100–199)
HDL: 54 mg/dL (ref >39)
LDL Chol Calc (NIH): 91 mg/dL (ref 0–99)
Triglycerides: 117 mg/dL (ref 0–149)
VLDL Cholesterol Cal: 21 mg/dL (ref 5–40)

## 2023-11-28 LAB — CMP14+EGFR
ALT: 26 IU/L (ref 0–32)
AST: 21 IU/L (ref 0–40)
Albumin: 4.6 g/dL (ref 3.9–4.9)
Alkaline Phosphatase: 51 IU/L (ref 44–121)
BUN/Creatinine Ratio: 26 (ref 12–28)
BUN: 19 mg/dL (ref 8–27)
Bilirubin Total: 0.8 mg/dL (ref 0.0–1.2)
CO2: 23 mmol/L (ref 20–29)
Calcium: 9.6 mg/dL (ref 8.7–10.3)
Chloride: 101 mmol/L (ref 96–106)
Creatinine, Ser: 0.74 mg/dL (ref 0.57–1.00)
Globulin, Total: 2.2 g/dL (ref 1.5–4.5)
Glucose: 107 mg/dL — ABNORMAL HIGH (ref 70–99)
Potassium: 4.1 mmol/L (ref 3.5–5.2)
Sodium: 140 mmol/L (ref 134–144)
Total Protein: 6.8 g/dL (ref 6.0–8.5)
eGFR: 89 mL/min/{1.73_m2} (ref >59)

## 2023-11-28 LAB — CBC
Hematocrit: 45 % (ref 34.0–46.6)
Hemoglobin: 15.2 g/dL (ref 11.1–15.9)
MCH: 31.9 pg (ref 26.6–33.0)
MCHC: 33.8 g/dL (ref 31.5–35.7)
MCV: 95 fL (ref 79–97)
Platelets: 205 10*3/uL (ref 150–450)
RBC: 4.76 x10E6/uL (ref 3.77–5.28)
RDW: 12.8 % (ref 11.7–15.4)
WBC: 4.7 10*3/uL (ref 3.4–10.8)

## 2023-11-28 LAB — TSH: TSH: 1.47 u[IU]/mL (ref 0.450–4.500)

## 2023-11-29 ENCOUNTER — Ambulatory Visit: Payer: Self-pay | Admitting: Family Medicine

## 2023-11-29 ENCOUNTER — Ambulatory Visit: Admission: EM | Admit: 2023-11-29 | Discharge: 2023-11-29 | Disposition: A | Discharge status: Home / Self Care

## 2023-11-29 DIAGNOSIS — R051 Acute cough: Secondary | ICD-10-CM | POA: Diagnosis not present

## 2023-11-29 DIAGNOSIS — I10 Essential (primary) hypertension: Secondary | ICD-10-CM | POA: Diagnosis not present

## 2023-11-29 NOTE — ED Triage Notes (Signed)
 Pt reports she has been having elevated blood pressure readings x 2 days   Has been having headache sob on exertion  Recently put on hydrochlorothiazide 

## 2023-11-29 NOTE — Discharge Instructions (Addendum)
 Your blood pressure was elevated in the clinic today. Continue taking blood pressure medication(s) as prescribed.  Continue exercising and eating low salt diet to reduce BP naturally.  If you develop chest pain, shortness of breath, weakness on one side of your body, severe headache, dizziness, etc, please go to the ER.  Follow-up with PCP for ongoing evaluation and management of your high blood pressure.

## 2023-11-29 NOTE — ED Provider Notes (Signed)
 [facility]    CSN: [id number] Arrival date & time: 11/29/23  1459      History   Chief Complaint Chief Complaint  Patient presents with  . Hypertension    HPI Katie Ingram is a 68 y.o. female.   Katie Ingram is a 68 y.o. female presenting for chief complaint of Hypertension. She was seen by her PCP for annual visit 2 days ago and was placed on 25mg  HCTZ.   Took a dose Saturday morning and this morning for the firs ttime She's had a lot of stress at home, older parents She believes this is causing increase in BP She's also having home renovations   Sore throat Eustachian tube dysfunction Dust in the home Darden is here this weekend Started daycare- he has the daycare crud Coughing  History of allergic rhinitis, takes singulair , zyrtec, and flonase  Tile and bathroom renovation is causing dust in the home and she thinks this is causing coughing Shortness of breath  Headache last few weeks to the forehead and the crown No visual disturbance Eyes are tired Coughing   Steak and hamburger last 2 days     Hypertension   Past Medical History:  Diagnosis Date  . Allergic rhinitis   . Elevated BP without diagnosis of hypertension 07/09/2023  . H/O goiter   . Hypothyroidism   . Seasonal allergies     Patient Active Problem List   Diagnosis Date Noted  . Primary hypertension 11/27/2023  . Dense breasts 11/17/2022  . Other hyperlipidemia 11/16/2021  . 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 Acute bacterial rhinosinusitis 05/08/2020  . Chronic insomnia 11/04/2019  . Systolic murmur 06/05/2017  . Rhinitis, allergic 01/18/2016  . Hypothyroidism 11/08/2012    Past Surgical History:  Procedure Laterality Date  . COLONOSCOPY    . COLONOSCOPY N/A 01/05/2019   Procedure: COLONOSCOPY;  Surgeon: Shaaron Lamar HERO, MD;  Location: [facility];  Service: Endoscopy;  Laterality: N/A;  10:30  . DILATION AND CURETTAGE OF UTERUS    . ENDOMETRIAL ABLATION    .  ESOPHAGOGASTRODUODENOSCOPY    . POLYPECTOMY  01/05/2019   Procedure: POLYPECTOMY;  Surgeon: Shaaron Lamar HERO, MD;  Location: [facility];  Service: Endoscopy;;  splenic flexurex2  . THYROID SURGERY    . TONSILLECTOMY AND ADENOIDECTOMY    . TUBAL LIGATION      OB History   No obstetric history on file.      Home Medications    Prior to Admission medications   Medication Sig Start Date End Date Taking? Authorizing Provider  Bacillus Coagulans-Inulin (ALIGN PREBIOTIC-PROBIOTIC PO) Take 1 capsule by mouth daily. One daily    [provider]  Blood Pressure Monitoring (OMRON 7 SERIES BP MONITOR) DEVI Use as directed to monitor home BP readings. 11/27/23   Mauro Elveria BROCKS, NP  cetirizine (ZYRTEC) 10 MG tablet Take 10 mg by mouth daily.    [provider]  fish oil-omega-3 fatty acids 1000 MG capsule Take 2 g by mouth daily.    [provider]  fluticasone  (FLONASE) 50 MCG/ACT nasal spray USE 2 SPRAYS IN EACH       NOSTRIL DAILY AS NEEDED Patient taking differently: Place 2 sprays into both nostrils 2 (two) times daily as needed for allergies. 10/01/15   Alphonsa Elsie RAMAN, MD  Glucosamine-Chondroitin (MOVE FREE PO) Take 1 tablet by mouth daily.    [provider]  hydrochlorothiazide  (HYDRODIURIL ) 25 MG tablet Take 1 tablet (25 mg total) by mouth daily.  11/27/23   Hoskins, Carolyn C, NP  levothyroxine  (SYNTHROID ) 50 MCG tablet TAKE (1) TABLET BY MOUTH ONCE DAILY. 11/26/23   Mauro Elveria BROCKS, NP  MAGNESIUM GLYCINATE PO  11/22/23   [provider]  montelukast  (SINGULAIR ) 10 MG tablet TAKE (1) TABLET BY MOUTH ONCE DAILY. 11/26/23   Hoskins, Carolyn C, NP  Multiple Vitamin (MULTIVITAMIN) tablet Take 1 tablet by mouth daily.    [provider]  Polyethyl Glycol-Propyl Glycol (SYSTANE ULTRA) 0.4-0.3 % SOLN Place 1 drop into both eyes 2 (two) times a day.    [provider]  psyllium (METAMUCIL) 58.6 % powder Take 1 packet by mouth  daily.    [provider]  rosuvastatin  (CRESTOR ) 5 MG tablet TAKE (1) TABLET BY MOUTH DAILY ON MONDAY, WEDNESDAY AND FRIDAY. 11/26/23   Mauro Elveria BROCKS, NP    Family History Family History  Problem Relation Age of Onset  . Hypertension Mother   . Arthritis Mother   . Endometrial cancer Mother   . Hypertension Father   . Hyperlipidemia Father   . Hypertension Brother   . Arthritis Maternal Grandmother   . Cancer Other        colon  . Colon cancer Other     Social History Social History   Tobacco Use  . Smoking status: Never    Passive exposure: Never  . Smokeless tobacco: Never  Vaping Use  . Vaping status: Never Used  Substance Use Topics  . Alcohol use: Yes    Alcohol/week: 0.0 standard drinks of alcohol    Comment: occas social  . Drug use: No     Allergies   Bactrim [sulfamethoxazole-trimethoprim], Biaxin [clarithromycin], Ceftin [cefuroxime axetil], Compazine [prochlorperazine edisylate], Floxacillin [floxacillin (flucloxacillin)], Levaquin  [levofloxacin  in d5w], and Tobramycin   Review of Systems Review of Systems   Physical Exam Triage Vital Signs ED Triage Vitals  Encounter Vitals Group     BP 11/29/23 1506 (!) 171/102     Girls Systolic BP Percentile --      Girls Diastolic BP Percentile --      Boys Systolic BP Percentile --      Boys Diastolic BP Percentile --      Pulse Rate 11/29/23 1506 80     Resp 11/29/23 1506 18     Temp 11/29/23 1506 97.9 F (36.6 C)     Temp Source 11/29/23 1506 Oral     SpO2 11/29/23 1506 97 %     Weight --      Height --      Head Circumference --      Peak Flow --      Pain Score 11/29/23 1507 2     Pain Loc --      Pain Education --      Exclude from Growth Chart --    No data found.  Updated Vital Signs BP (!) 171/102   Pulse 80   Temp 97.9 F (36.6 C) (Oral)   Resp 18   SpO2 97%   Visual Acuity Right Eye Distance:   Left Eye Distance:   Bilateral Distance:    Right Eye Near:   Left  Eye Near:    Bilateral Near:     Physical Exam   UC Treatments / Results  Labs (all labs ordered are listed, but only abnormal results are displayed) Labs Reviewed - No data to display  EKG   Radiology No results found.  Procedures Procedures (including critical care time)  Medications Ordered  in UC Medications - No data to display  Initial Impression / Assessment and Plan / UC Course  I have reviewed the triage vital signs and the nursing notes.  Pertinent labs & imaging results that were available during my care of the patient were reviewed by me and considered in my medical decision making (see chart for details).     *** Final Clinical Impressions(s) / UC Diagnoses   Final diagnoses:  None   Discharge Instructions   None    ED Prescriptions   None    PDMP not reviewed this encounter.

## 2023-12-07 ENCOUNTER — Ambulatory Visit (HOSPITAL_COMMUNITY)
Admission: RE | Admit: 2023-12-07 | Discharge: 2023-12-07 | Disposition: A | Discharge status: Home / Self Care | Source: Ambulatory Visit | Attending: Nurse Practitioner | Admitting: Nurse Practitioner

## 2023-12-07 DIAGNOSIS — Z1231 Encounter for screening mammogram for malignant neoplasm of breast: Secondary | ICD-10-CM | POA: Diagnosis not present

## 2023-12-09 NOTE — Telephone Encounter (Signed)
 Patient see in office 11/26/23 and results discussed by provider

## 2023-12-17 ENCOUNTER — Encounter (INDEPENDENT_AMBULATORY_CARE_PROVIDER_SITE_OTHER): Payer: Self-pay | Admitting: *Deleted

## 2023-12-30 ENCOUNTER — Telehealth: Payer: Self-pay

## 2023-12-30 NOTE — Telephone Encounter (Signed)
 Who is your primary care physician: Elveria Quarry NP [facility]  Reasons for the colonoscopy: Hx polyps  Have you had a colonoscopy before?  Yes   Do you have family history of colon cancer? Yes MGM/MGF  Previous colonoscopy with polyps removed? yes  Do you have a history colorectal cancer?   no  Are you diabetic? If yes, Type 1 or Type 2?    no  Do you have a prosthetic or mechanical heart valve? no  Do you have a pacemaker/defibrillator?   no  Have you had endocarditis/atrial fibrillation? no  Have you had joint replacement within the last 12 months?  no  Do you tend to be constipated or have to use laxatives? No answer   Do you have any history of drugs or alchohol?  No answer  Do you use supplemental oxygen?  no  Have you had a stroke or heart attack within the last 6 months? No answer  Do you take weight loss medication?  no  For female patients: have you had a hysterectomy?  no                                     are you post menopausal?       yes                                            do you still have your menstrual cycle? No endometrial ablation    2006  Do you take any blood-thinning medications such as: (aspirin, warfarin, Plavix, Aggrenox)  no  If yes we need the name, milligram, dosage and who is prescribing doctor  Current Outpatient Medications on File Prior to Visit  Medication Sig Dispense Refill   Bacillus Coagulans-Inulin (ALIGN PREBIOTIC-PROBIOTIC PO) Take 1 capsule by mouth daily. One daily     cetirizine (ZYRTEC) 10 MG tablet Take 10 mg by mouth daily.     FAMOTIDINE  PO Take 20 mg by mouth daily as needed.     fish oil-omega-3 fatty acids 1000 MG capsule Take 2 g by mouth daily.     fluticasone  (FLONASE) 50 MCG/ACT nasal spray USE 2 SPRAYS IN EACH       NOSTRIL DAILY AS NEEDED 48 g 3   Glucosamine-Chondroitin (MOVE FREE PO) Take 1 tablet by mouth daily.     hydrochlorothiazide  (HYDRODIURIL ) 25 MG tablet Take 1 tablet (25 mg total) by  mouth daily. 90 tablet 0   levothyroxine  (SYNTHROID ) 50 MCG tablet TAKE (1) TABLET BY MOUTH ONCE DAILY. 90 tablet 3   MAGNESIUM GLYCINATE PO      montelukast  (SINGULAIR ) 10 MG tablet TAKE (1) TABLET BY MOUTH ONCE DAILY. 90 tablet 3   Multiple Vitamin (MULTIVITAMIN) tablet Take 1 tablet by mouth daily.     psyllium (METAMUCIL) 58.6 % powder Take 1 packet by mouth daily.     rosuvastatin  (CRESTOR ) 5 MG tablet TAKE (1) TABLET BY MOUTH DAILY ON MONDAY, WEDNESDAY AND FRIDAY. 36 tablet 3   No current facility-administered medications on file prior to visit.    Allergies  Allergen Reactions   Bactrim [Sulfamethoxazole-Trimethoprim] Itching   Biaxin [Clarithromycin]     'cannot swallow   Ceftin [Cefuroxime Axetil]    Compazine [Prochlorperazine Edisylate]    Floxacillin [Floxacillin (Flucloxacillin)]    Levaquin  [  Levofloxacin  In D5w]     Swelling and itching of the eyes   Tobramycin     swelling     Pharmacy: [facility] Name: Hulan Many [phone]  Best number where you can be reached: [phone]

## 2024-01-02 NOTE — Progress Notes (Signed)
 Office Visit Note  Patient: Katie Ingram             Date of Birth: [birth year]           MRN: [id number]             PCP: Cook, Jayce G, DO Referring: Cook, Jayce G, DO Visit Date: 01/15/2024 Occupation: @GUAROCC @  Subjective:  Joint stiffness  History of Present Illness: Evangelia Whitaker is a 68 y.o. female patient returns today after the last visit on January 15, 2023 for her sicca symptoms, positive ANA and osteoarthritis.  She continues to have some stiffness and discomfort in her joints especially her hands.  She states her CMC joints are painful.  She uses braces as needed.  She continues to have some discomfort in her knee joints.  She has not noticed any joint swelling.  Dry mouth and dry eye symptoms are manageable.  She has been using over-the-counter products.  She denies any history of shortness of breath or palpitations.  She denies enlarged lymph nodes.    Activities of Daily Living:  Patient reports morning stiffness for less than 5 minutes.   Patient Reports nocturnal pain.  Difficulty dressing/grooming: Denies Difficulty climbing stairs: Denies Difficulty getting out of chair: Denies Difficulty using hands for taps, buttons, cutlery, and/or writing: Denies  Review of Systems  Constitutional:  Negative for fatigue.  HENT:  Positive for mouth dryness. Negative for mouth sores.   Eyes:  Positive for dryness.  Respiratory:  Negative for shortness of breath.   Cardiovascular:  Negative for chest pain and palpitations.  Gastrointestinal:  Negative for blood in stool, constipation and diarrhea.  Endocrine: Negative for increased urination.  Genitourinary:  Negative for involuntary urination.  Musculoskeletal:  Positive for joint pain, joint pain, joint swelling and morning stiffness. Negative for gait problem, myalgias, muscle weakness, muscle tenderness and myalgias.  Skin:  Negative for color change, rash, hair loss and sensitivity to sunlight.  Allergic/Immunologic:  Negative for susceptible to infections.  Neurological:  Negative for dizziness and headaches.  Hematological:  Negative for swollen glands.  Psychiatric/Behavioral:  Positive for sleep disturbance. Negative for depressed mood. The patient is not nervous/anxious.     PMFS History:  Patient Active Problem List   Diagnosis Date Noted   Viral URI 01/13/2024   Primary hypertension 11/27/2023   Dense breasts 11/17/2022   Other hyperlipidemia 11/16/2021   Hypoestrogenism 11/10/2020   Acute bacterial rhinosinusitis 05/08/2020   Chronic insomnia 11/04/2019   Systolic murmur 06/05/2017   Rhinitis, allergic 01/18/2016   Hypothyroidism 11/08/2012    Past Medical History:  Diagnosis Date   Allergic rhinitis    Elevated BP without diagnosis of hypertension 07/09/2023   H/O goiter    Hypothyroidism    Seasonal allergies     Family History  Problem Relation Age of Onset   Hypertension Mother    Arthritis Mother    Endometrial cancer Mother    Hypertension Father    Hyperlipidemia Father    Hypertension Brother    Arthritis Maternal Grandmother    Cancer Other        colon   Colon cancer Other    Past Surgical History:  Procedure Laterality Date   COLONOSCOPY     COLONOSCOPY N/A 01/05/2019   Procedure: COLONOSCOPY;  Surgeon: Shaaron Lamar HERO, MD;  Location: [facility];  Service: Endoscopy;  Laterality: N/A;  10:30   DILATION AND CURETTAGE OF UTERUS     ENDOMETRIAL ABLATION  ESOPHAGOGASTRODUODENOSCOPY     POLYPECTOMY  01/05/2019   Procedure: POLYPECTOMY;  Surgeon: Shaaron Lamar HERO, MD;  Location: [facility];  Service: Endoscopy;;  splenic flexurex2   THYROID SURGERY     TONSILLECTOMY AND ADENOIDECTOMY     TUBAL LIGATION     Social History   Social History Narrative   Not on file   Immunization History  Administered Date(s) Administered   Influenza,inj,Quad PF,6+ Mos 03/23/2020   Influenza-Unspecified 04/23/2021   Moderna Sars-Covid-2 Vaccination 07/21/2019,  08/18/2019, 04/22/2020   PNEUMOCOCCAL CONJUGATE-20 11/17/2022   Tdap 07/23/2018   Zoster Recombinant(Shingrix) 07/31/2017, 11/12/2017   Zoster, Live 09/18/2011     Objective: Vital Signs: BP 129/86 (BP Location: Left Arm, Patient Position: Sitting, Cuff Size: Normal)   Pulse 91   Resp 14   Ht 5' 7 (1.702 m)   Wt 162 lb 12.8 oz (73.8 kg)   BMI 25.50 kg/m    Physical Exam Vitals and nursing note reviewed.  Constitutional:      Appearance: She is well-developed.  HENT:     Head: Normocephalic and atraumatic.  Eyes:     Conjunctiva/sclera: Conjunctivae normal.  Cardiovascular:     Rate and Rhythm: Normal rate and regular rhythm.     Heart sounds: Normal heart sounds.  Pulmonary:     Effort: Pulmonary effort is normal.     Breath sounds: Normal breath sounds.  Abdominal:     General: Bowel sounds are normal.     Palpations: Abdomen is soft.  Musculoskeletal:     Cervical back: Normal range of motion.  Lymphadenopathy:     Cervical: No cervical adenopathy.  Skin:    General: Skin is warm and dry.     Capillary Refill: Capillary refill takes less than 2 seconds.  Neurological:     Mental Status: She is alert and oriented to person, place, and time.  Psychiatric:        Behavior: Behavior normal.      Musculoskeletal Exam: Cervical, thoracic and lumbar spine were in good range of motion.  Shoulders, elbows, wrist joints, MCPs PIPs and DIPs were in good range of motion.  She had bilateral CMC thickening and prominence.  PIP and DIP thickening was noted.  No synovitis was noted.  Hip joints and knee joints in good range of motion.  No warmth swelling or effusion was noted in the knee joints.  There was no tenderness over ankles or MTPs.  CDAI Exam: CDAI Score: -- Patient Global: --; Provider Global: -- Swollen: --; Tender: -- Joint Exam 01/15/2024   No joint exam has been documented for this visit   There is currently no information documented on the homunculus. Go to  the Rheumatology activity and complete the homunculus joint exam.  Investigation: No additional findings.  Imaging: No results found.   Recent Labs: Lab Results  Component Value Date   WBC 4.7 11/27/2023   HGB 15.2 11/27/2023   PLT 205 11/27/2023   NA 140 11/27/2023   K 4.1 11/27/2023   CL 101 11/27/2023   CO2 23 11/27/2023   GLUCOSE 107 (H) 11/27/2023   BUN 19 11/27/2023   CREATININE 0.74 11/27/2023   BILITOT 0.8 11/27/2023   ALKPHOS 51 11/27/2023   AST 21 11/27/2023   ALT 26 11/27/2023   PROT 6.8 11/27/2023   ALBUMIN 4.6 11/27/2023   CALCIUM  9.6 11/27/2023   GFRAA 86 10/26/2019   12/10/22 ANA 1:80 NH, ENA(RNP,Sm,Ro,La,ds DNA) negative,C3 and C4 normal,sed rate normal, RF negative  Speciality Comments: No specialty comments available.  Procedures:  No procedures performed Allergies: Bactrim [sulfamethoxazole-trimethoprim], Biaxin [clarithromycin], Ceftin [cefuroxime axetil], Compazine [prochlorperazine edisylate], Floxacillin [floxacillin (flucloxacillin)], Levaquin  [levofloxacin  in d5w], and Tobramycin   Assessment / Plan:     Visit Diagnoses: Sicca syndrome (HCC) - Positive ANA, SSA negative, SSB negative, RF negative, dry mouth and dry eyes: Questionable history of lip swelling on pilocarpine  and Biotene toothpaste.  Patient has been using over-the-counter products which are effective.  She states dry mouth and dry eye symptoms are manageable.  She denies history of shortness of breath or palpitations.  She had no parotid swelling or lymphadenopathy.  Chest was clear to auscultation.  November 27, 2023 CBC and CMP were normal.  In July 2024 ANA was positive and rest that autoimmune labs were negative.  Labs were reviewed with the patient.  Primary osteoarthritis of both hands - She was evaluated by Dr. Camella in the past.  She has severe CMC arthritis.  She has been using CMC braces as needed.  Joint protection muscle strengthening was discussed.  Chronic pain of both  knees-she continues to have some discomfort in her knee joints.  Lower extremity muscle strength exercises were discussed.  No warmth swelling or effusion was noted.  The medical problems are listed as follows:  Systolic murmur  Other hyperlipidemia  History of gastroesophageal reflux (GERD)  Seasonal allergic rhinitis due to other allergic trigger  Chronic insomnia  Other specified hypothyroidism  Osteoporosis screening - DEXA scan normal on November 19, 2022.  Orders: No orders of the defined types were placed in this encounter.  No orders of the defined types were placed in this encounter.  .  Follow-Up Instructions: Return in about 1 year (around 01/14/2025) for Sicca, OA.   Maya Nash, MD  Note - This record has been created using Animal nutritionist.  Chart creation errors have been sought, but may not always  have been located. Such creation errors do not reflect on  the standard of medical care.

## 2024-01-06 DIAGNOSIS — L570 Actinic keratosis: Secondary | ICD-10-CM | POA: Diagnosis not present

## 2024-01-06 DIAGNOSIS — X32XXXD Exposure to sunlight, subsequent encounter: Secondary | ICD-10-CM | POA: Diagnosis not present

## 2024-01-08 ENCOUNTER — Encounter: Payer: Self-pay | Admitting: Nurse Practitioner

## 2024-01-08 ENCOUNTER — Ambulatory Visit: Admitting: Nurse Practitioner

## 2024-01-08 VITALS — BP 133/86 | HR 80 | Temp 97.8°F | Ht 66.0 in | Wt 161.0 lb

## 2024-01-08 DIAGNOSIS — I10 Essential (primary) hypertension: Secondary | ICD-10-CM | POA: Diagnosis not present

## 2024-01-08 MED ORDER — AMLODIPINE BESYLATE 2.5 MG PO TABS: 2.5000 mg | ORAL_TABLET | Freq: Every day | ORAL | 0 refills | Status: DC

## 2024-01-08 NOTE — Progress Notes (Signed)
 Subjective:    Patient ID: Katie Ingram, female    DOB: [birth year], 68 y.o.   MRN: [id number]  HPI Presents for recheck on her BP. Was seen at [facility] on 11/29/23 for elevated BP of 171/102. Cannot identify any specific triggers but has been under excessive stress this summer related to family issues. BP readings at home since then 134-155/86-97. Currently on hydrochlorothiazide  25 mg daily. FMH: parents and brother have HTN.  Review of Systems  Eyes:  Negative for visual disturbance.  Respiratory:  Negative for cough, chest tightness and shortness of breath.   Cardiovascular:  Negative for chest pain and leg swelling.  Neurological:  Negative for syncope, facial asymmetry, speech difficulty, weakness and numbness.      01/08/2024    8:59 AM  Depression screen PHQ 2/9  Decreased Interest 0  Down, Depressed, Hopeless 0  PHQ - 2 Score 0  Altered sleeping 0  Tired, decreased energy 0  Change in appetite 0  Feeling bad or failure about yourself  0  Trouble concentrating 0  Moving slowly or fidgety/restless 0  Suicidal thoughts 0  PHQ-9 Score 0  Difficult doing work/chores Not difficult at all      01/08/2024    8:59 AM 11/26/2023    9:37 AM 07/09/2023    9:52 AM 11/17/2022    8:38 AM  GAD 7 : Generalized Anxiety Score  Nervous, Anxious, on Edge 1 0 0 0  Control/stop worrying 1 0 0 0  Worry too much - different things 1 0 0 0  Trouble relaxing 1 0 0 0  Restless 0 0 0 0  Easily annoyed or irritable 1 0 0 0  Afraid - awful might happen 0 0 0 0  Total GAD 7 Score 5 0 0 0  Anxiety Difficulty Not difficult at all   Not difficult at all    Social History   Tobacco Use   Smoking status: Never    Passive exposure: Never   Smokeless tobacco: Never  Vaping Use   Vaping status: Never Used  Substance Use Topics   Alcohol use: Yes    Alcohol/week: 0.0 standard drinks of alcohol    Comment: occas social   Drug use: No        Objective:   Physical Exam Vitals and nursing note  reviewed.  Constitutional:      General: She is not in acute distress. Neck:     Comments: Carotids no bruits or thrills.  Cardiovascular:     Rate and Rhythm: Normal rate and regular rhythm.     Heart sounds: Normal heart sounds.  Pulmonary:     Effort: Pulmonary effort is normal.     Breath sounds: Normal breath sounds.  Musculoskeletal:     Right lower leg: No edema.     Left lower leg: No edema.  Neurological:     Mental Status: She is alert and oriented to person, place, and time.  Psychiatric:        Mood and Affect: Mood normal.        Behavior: Behavior normal.        Thought Content: Thought content normal.        Judgment: Judgment normal.    Today's Vitals   01/08/24 0854 01/08/24 0936  BP: (!) 144/93 133/86  Pulse: 80   Temp: 97.8 F (36.6 C)   SpO2: 99%   Weight: 161 lb (73 kg)   Height: 5' 6 (1.676 m)  Body mass index is 25.99 kg/m.        Assessment & Plan:   Problem List Items Addressed This Visit       Cardiovascular and Mediastinum   Primary hypertension - Primary   Relevant Medications   amLODipine (NORVASC) 2.5 MG tablet   Meds ordered this encounter  Medications   amLODipine (NORVASC) 2.5 MG tablet    Sig: Take 1 tablet (2.5 mg total) by mouth daily.    Dispense:  90 tablet    Refill:  0    Supervising Provider:   ALPHONSA GLENDIA LABOR [phone]   Defers treatment for anxiety and stress. Patient continues regular activity such as walking.  Start low dose Amlodipine as directed. Continue to monitor BP at home. Contact office if not consistently below 140/90.  Return in about 3 months (around 04/09/2024).

## 2024-01-12 ENCOUNTER — Encounter: Payer: Self-pay | Admitting: Nurse Practitioner

## 2024-01-13 ENCOUNTER — Ambulatory Visit (INDEPENDENT_AMBULATORY_CARE_PROVIDER_SITE_OTHER): Admitting: Physician Assistant

## 2024-01-13 ENCOUNTER — Encounter: Payer: Self-pay | Admitting: Physician Assistant

## 2024-01-13 VITALS — BP 120/76 | HR 128 | Temp 98.4°F | Ht 66.0 in | Wt 162.0 lb

## 2024-01-13 DIAGNOSIS — J069 Acute upper respiratory infection, unspecified: Secondary | ICD-10-CM

## 2024-01-13 HISTORY — DX: Acute upper respiratory infection, unspecified: J06.9

## 2024-01-13 LAB — POCT RAPID STREP A (OFFICE): Rapid Strep A Screen: NEGATIVE

## 2024-01-13 NOTE — Progress Notes (Signed)
   Acute Office Visit  Subjective:     Patient ID: Nari Vannatter, female    DOB: [birth year], 68 y.o.   MRN: [id number]   Patient presents today with complaints of sore throat and sinus pain. States symptoms began last week but have worsened today. Associated symptoms include headache, cough, post nasal drainage, and ear pressure. States fever of 99.5 at home. Reports her husband is also sick with similar symptoms. Denies chest pain, shortness of breath, or difficulty breathing. Has not taken OTC cold medication. Is eating and drinking per usual.      Review of Systems  Constitutional:  Positive for fever and malaise/fatigue. Negative for chills.  HENT:  Positive for ear pain, sinus pain and sore throat. Negative for congestion and ear discharge.   Respiratory:  Positive for cough and sputum production. Negative for shortness of breath.   Cardiovascular:  Negative for chest pain and palpitations.  Musculoskeletal:  Negative for myalgias.  Neurological:  Positive for headaches. Negative for dizziness, sensory change and weakness.        Objective:     BP 120/76   Pulse (!) 128   Temp 98.4 F (36.9 C)   Ht 5' 6 (1.676 m)   Wt 162 lb (73.5 kg)   SpO2 100%   BMI 26.15 kg/m   Physical Exam Vitals reviewed.  Constitutional:      General: She is not in acute distress.    Appearance: Normal appearance. She is not ill-appearing.  HENT:     Right Ear: Tympanic membrane normal.     Left Ear: Tympanic membrane normal.     Nose: Rhinorrhea present.     Mouth/Throat:     Mouth: Mucous membranes are moist.     Pharynx: Oropharynx is clear. Posterior oropharyngeal erythema present.  Eyes:     Extraocular Movements: Extraocular movements intact.     Conjunctiva/sclera: Conjunctivae normal.  Cardiovascular:     Rate and Rhythm: Normal rate and regular rhythm.     Heart sounds: Normal heart sounds. No murmur heard. Pulmonary:     Effort: Pulmonary effort is normal.     Breath  sounds: Normal breath sounds. No wheezing, rhonchi or rales.  Lymphadenopathy:     Cervical: No cervical adenopathy.  Skin:    General: Skin is warm and dry.  Neurological:     General: No focal deficit present.     Mental Status: She is alert and oriented to person, place, and time.  Psychiatric:        Mood and Affect: Mood normal.        Behavior: Behavior normal.     No results found for any visits on 01/13/24.      Assessment & Plan:  Viral URI Assessment & Plan: Patient appears stable today. Benign rather exam. Negative strep test in office. Likely self-resolving viral infection. Supportive care reviewed with patient. Discussed with patient that there are no indications for antibiotics at this time, and viral respiratory illness can be persistent in duration.Tylenol or ibuprofen for pain or fever as needed. I advised Flonase for nasal congestion and ear pressure. May continue with OTC cold medications. Patient instructed to return to clinic if worsening shortness of breath, chest pain, hypoxia, or other concerns. Patient agreeable to plan.     Return if symptoms worsen or fail to improve.  Charmaine Ivey Cina, PA-C

## 2024-01-13 NOTE — Assessment & Plan Note (Signed)
 Patient appears stable today. Benign rather exam. Negative strep test in office. Likely self-resolving viral infection. Supportive care reviewed with patient. Discussed with patient that there are no indications for antibiotics at this time, and viral respiratory illness can be persistent in duration.Tylenol or ibuprofen for pain or fever as needed. I advised Flonase for nasal congestion and ear pressure. May continue with OTC cold medications. Patient instructed to return to clinic if worsening shortness of breath, chest pain, hypoxia, or other concerns. Patient agreeable to plan.

## 2024-01-13 NOTE — Addendum Note (Signed)
 Addended byBETHA DELA BOTTCHER on: 01/13/2024 04:52 PM   Modules accepted: Orders

## 2024-01-14 ENCOUNTER — Other Ambulatory Visit: Payer: Self-pay | Admitting: Nurse Practitioner

## 2024-01-14 MED ORDER — AMOXICILLIN-POT CLAVULANATE 875-125 MG PO TABS: 1.0000 | ORAL_TABLET | Freq: Two times a day (BID) | ORAL | 0 refills | Status: DC

## 2024-01-15 ENCOUNTER — Encounter: Payer: Self-pay | Admitting: Rheumatology

## 2024-01-15 ENCOUNTER — Ambulatory Visit: Payer: Medicare HMO | Attending: Rheumatology | Admitting: Rheumatology

## 2024-01-15 VITALS — BP 129/86 | HR 91 | Resp 14 | Ht 67.0 in | Wt 162.8 lb

## 2024-01-15 DIAGNOSIS — E038 Other specified hypothyroidism: Secondary | ICD-10-CM | POA: Diagnosis not present

## 2024-01-15 DIAGNOSIS — F5104 Psychophysiologic insomnia: Secondary | ICD-10-CM | POA: Diagnosis not present

## 2024-01-15 DIAGNOSIS — M19042 Primary osteoarthritis, left hand: Secondary | ICD-10-CM | POA: Diagnosis not present

## 2024-01-15 DIAGNOSIS — M3500 Sicca syndrome, unspecified: Secondary | ICD-10-CM

## 2024-01-15 DIAGNOSIS — R011 Cardiac murmur, unspecified: Secondary | ICD-10-CM

## 2024-01-15 DIAGNOSIS — J3089 Other allergic rhinitis: Secondary | ICD-10-CM | POA: Diagnosis not present

## 2024-01-15 DIAGNOSIS — M25561 Pain in right knee: Secondary | ICD-10-CM

## 2024-01-15 DIAGNOSIS — M19041 Primary osteoarthritis, right hand: Secondary | ICD-10-CM

## 2024-01-15 DIAGNOSIS — Z13820 Encounter for screening for osteoporosis: Secondary | ICD-10-CM | POA: Diagnosis not present

## 2024-01-15 DIAGNOSIS — G8929 Other chronic pain: Secondary | ICD-10-CM

## 2024-01-15 DIAGNOSIS — E7849 Other hyperlipidemia: Secondary | ICD-10-CM

## 2024-01-15 DIAGNOSIS — M25562 Pain in left knee: Secondary | ICD-10-CM | POA: Diagnosis not present

## 2024-01-15 DIAGNOSIS — Z8719 Personal history of other diseases of the digestive system: Secondary | ICD-10-CM

## 2024-01-28 ENCOUNTER — Encounter: Payer: Self-pay | Admitting: Emergency Medicine

## 2024-01-28 ENCOUNTER — Ambulatory Visit
Admission: EM | Admit: 2024-01-28 | Discharge: 2024-01-28 | Disposition: A | Discharge status: Home / Self Care | Attending: Family Medicine | Admitting: Family Medicine

## 2024-01-28 DIAGNOSIS — J3089 Other allergic rhinitis: Secondary | ICD-10-CM

## 2024-01-28 DIAGNOSIS — J0100 Acute maxillary sinusitis, unspecified: Secondary | ICD-10-CM | POA: Diagnosis not present

## 2024-01-28 HISTORY — DX: Essential (primary) hypertension: I10

## 2024-01-28 MED ORDER — DOXYCYCLINE HYCLATE 100 MG PO CAPS: 100.0000 mg | ORAL_CAPSULE | Freq: Two times a day (BID) | ORAL | 0 refills | Status: DC

## 2024-01-28 MED ORDER — DEXAMETHASONE SODIUM PHOSPHATE 10 MG/ML IJ SOLN
10.0000 mg | Freq: Once | INTRAMUSCULAR | Status: AC
  Administered 2024-01-28: 10 mg via INTRAMUSCULAR

## 2024-01-28 NOTE — Discharge Instructions (Signed)
 Take the full course of antibiotics prescribed, continue your allergy regimen consistently every day, use saline sinus rinses, humidifiers and other supportive remedies as needed.

## 2024-01-28 NOTE — ED Triage Notes (Signed)
 Scratchy throat, low grade fevers,sinus pressure and nasal drainage x 4 weeks.   has taken amoxicillin  and finished that last Friday.  States symptoms did get better and then came back.  Has been taking sudafed.

## 2024-01-28 NOTE — ED Provider Notes (Signed)
 [facility]    CSN: [id number] Arrival date & time: 01/28/24  1251      History   Chief Complaint No chief complaint on file.   HPI Katie Ingram is a 68 y.o. female.   Patient today with about a month of ongoing low-grade fevers, sinus pain and pressure, nasal congestion, scratchy throat.  She states she has been dealing with acute on chronic sinusitis since she was 68 years old and has a history of severe seasonal allergies followed by allergist/ENT.  She is on a consistent allergy regimen of antihistamines, Singulair , Flonase and states she completed a course of amoxicillin  last week.  States she started feeling a bit better but symptoms have returned and worsened since being off the medication and feels like the course was not long enough.  Denies chest pain, shortness of breath, abdominal pain, vomiting, diarrhea.    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   Acute bacterial rhinosinusitis 05/08/2020   Chronic insomnia 11/04/2019   Systolic murmur 06/05/2017   Rhinitis, allergic 01/18/2016   Hypothyroidism 11/08/2012    Past Surgical History:  Procedure Laterality Date   COLONOSCOPY     COLONOSCOPY N/A 01/05/2019   Procedure: COLONOSCOPY;  Surgeon: Shaaron Lamar HERO, MD;  Location: [facility];  Service: Endoscopy;  Laterality: N/A;  10:30   DILATION AND CURETTAGE OF UTERUS     ENDOMETRIAL ABLATION     ESOPHAGOGASTRODUODENOSCOPY     POLYPECTOMY  01/05/2019   Procedure: POLYPECTOMY;  Surgeon: Shaaron Lamar HERO, MD;  Location: [facility];  Service: Endoscopy;;  splenic flexurex2   THYROID SURGERY      TONSILLECTOMY AND ADENOIDECTOMY     TUBAL LIGATION      OB History   No obstetric history on file.      Home Medications    Prior to Admission medications   Medication Sig Start Date End Date Taking? Authorizing Provider  doxycycline  (VIBRAMYCIN ) 100 MG capsule Take 1 capsule (100 mg total) by mouth 2 (two) times daily. 01/28/24  Yes Stuart Vernell Norris, PA-C  amLODipine  (NORVASC ) 2.5 MG tablet Take 1 tablet (2.5 mg total) by mouth daily. 01/08/24   Mauro Elveria BROCKS, NP  Bacillus Coagulans-Inulin (ALIGN PREBIOTIC-PROBIOTIC PO) Take 1 capsule by mouth daily. One daily    [provider]  cetirizine (ZYRTEC) 10 MG tablet Take 10 mg by mouth daily.    [provider]  FAMOTIDINE  PO Take 20 mg by mouth daily as needed.    [provider]  fish oil-omega-3 fatty acids 1000 MG capsule Take 2 g by mouth daily.    [provider]  fluticasone  [facility]) 50 MCG/ACT nasal spray USE 2 SPRAYS IN EACH       NOSTRIL DAILY AS NEEDED 10/01/15   Alphonsa Elsie RAMAN, MD  Glucosamine-Chondroitin (MOVE FREE PO) Take 1 tablet by mouth daily.    [provider]  hydrochlorothiazide  (HYDRODIURIL ) 25 MG tablet Take 1 tablet (25 mg total) by mouth daily. 11/27/23   Hoskins, Carolyn C, NP  levothyroxine  (SYNTHROID ) 50 MCG tablet TAKE (1) TABLET BY MOUTH ONCE  DAILY. 11/26/23   Mauro Elveria BROCKS, NP  MAGNESIUM GLYCINATE PO  11/22/23   [provider]  montelukast  (SINGULAIR ) 10 MG tablet TAKE (1) TABLET BY MOUTH ONCE DAILY. 11/26/23   Hoskins, Carolyn C, NP  Multiple Vitamin (MULTIVITAMIN) tablet Take 1 tablet by mouth daily.    [provider]  psyllium (METAMUCIL) 58.6 % powder Take 1 packet by mouth daily.    [provider]  rosuvastatin  (CRESTOR ) 5 MG tablet TAKE (1) TABLET BY MOUTH DAILY ON MONDAY, WEDNESDAY AND FRIDAY. 11/26/23   Mauro Elveria BROCKS, NP    Family History Family History  Problem Relation Age of Onset   Hypertension Mother     Arthritis Mother    Endometrial cancer Mother    Hypertension Father    Hyperlipidemia Father    Hypertension Brother    Arthritis Maternal Grandmother    Cancer Other        colon   Colon cancer Other     Social History Social History   Tobacco Use   Smoking status: Never    Passive exposure: Never   Smokeless tobacco: Never  Vaping Use   Vaping status: Never Used  Substance Use Topics   Alcohol use: Yes    Alcohol/week: 0.0 standard drinks of alcohol    Comment: occas social   Drug use: No     Allergies   Bactrim [sulfamethoxazole-trimethoprim], Biaxin [clarithromycin], Ceftin [cefuroxime axetil], Compazine [prochlorperazine edisylate], Floxacillin [floxacillin (flucloxacillin)], Levaquin  [levofloxacin  in d5w], and Tobramycin   Review of Systems Review of Systems Per HPI  Physical Exam Triage Vital Signs ED Triage Vitals  Encounter Vitals Group     BP 01/28/24 1300 (!) 161/97     Girls Systolic BP Percentile --      Girls Diastolic BP Percentile --      Boys Systolic BP Percentile --      Boys Diastolic BP Percentile --      Pulse Rate 01/28/24 1300 77     Resp 01/28/24 1300 18     Temp 01/28/24 1300 99.3 F (37.4 C)     Temp Source 01/28/24 1300 Oral     SpO2 01/28/24 1300 97 %     Weight --      Height --      Head Circumference --      Peak Flow --      Pain Score 01/28/24 1303 6     Pain Loc --      Pain Education --      Exclude from Growth Chart --    No data found.  Updated Vital Signs BP (!) 161/97 (BP Location: Right Arm)   Pulse 77   Temp 99.3 F (37.4 C) (Oral)   Resp 18   SpO2 97%   Visual Acuity Right Eye Distance:   Left Eye Distance:   Bilateral Distance:    Right Eye Near:   Left Eye Near:    Bilateral Near:     Physical Exam Vitals and nursing note reviewed.  Constitutional:      Appearance: Normal appearance.  HENT:     Head: Atraumatic.     Right Ear: Tympanic membrane and external ear normal.     Left Ear:  Tympanic membrane and external ear normal.     Nose: Congestion present.     Mouth/Throat:     Mouth: Mucous membranes are moist.     Pharynx: Posterior oropharyngeal erythema present.  Eyes:     Extraocular  Movements: Extraocular movements intact.     Conjunctiva/sclera: Conjunctivae normal.  Cardiovascular:     Rate and Rhythm: Normal rate and regular rhythm.     Heart sounds: Normal heart sounds.  Pulmonary:     Effort: Pulmonary effort is normal.     Breath sounds: Normal breath sounds. No wheezing or rales.  Musculoskeletal:        General: Normal range of motion.     Cervical back: Normal range of motion and neck supple.  Skin:    General: Skin is warm and dry.  Neurological:     Mental Status: She is alert and oriented to person, place, and time.  Psychiatric:        Mood and Affect: Mood normal.        Thought Content: Thought content normal.      UC Treatments / Results  Labs (all labs ordered are listed, but only abnormal results are displayed) Labs Reviewed - No data to display  EKG   Radiology No results found.  Procedures Procedures (including critical care time)  Medications Ordered in UC Medications  dexamethasone  (DECADRON ) injection 10 mg (10 mg Intramuscular Given 01/28/24 1319)    Initial Impression / Assessment and Plan / UC Course  I have reviewed the triage vital signs and the nursing notes.  Pertinent labs & imaging results that were available during my care of the patient were reviewed by me and considered in my medical decision making (see chart for details).     Hypertensive in triage, otherwise vital signs within normal limits.  Given chronicity and worsening course will treat with doxycycline , IM Decadron , supportive over-the-counter medications and home care.  Continue allergy regimen consistently.  Return for worsening symptoms.  Final Clinical Impressions(s) / UC Diagnoses   Final diagnoses:  Acute maxillary sinusitis,  recurrence not specified  Seasonal allergic rhinitis due to other allergic trigger     Discharge Instructions      Take the full course of antibiotics prescribed, continue your allergy regimen consistently every day, use saline sinus rinses, humidifiers and other supportive remedies as needed.    ED Prescriptions     Medication Sig Dispense Auth. Provider   doxycycline  (VIBRAMYCIN ) 100 MG capsule Take 1 capsule (100 mg total) by mouth 2 (two) times daily. 20 capsule Stuart Vernell Norris, [place]      PDMP not reviewed this encounter.   Stuart Vernell Norris, PA-C 01/28/24 1335

## 2024-02-03 NOTE — Telephone Encounter (Signed)
 LMTRC

## 2024-02-03 NOTE — Telephone Encounter (Signed)
 Please contact patient to confirm answers to the following questions that were not answered: Do you tend to be constipated or have to use laxatives? Do you have any history of drug or alcohol use?  As long as the answers to these questions are no, we can proceed with scheduling. ASA 2. Will need BMP prior.

## 2024-02-03 NOTE — Telephone Encounter (Signed)
 LMTRC  Pt left vm returning call

## 2024-02-04 ENCOUNTER — Encounter: Payer: Self-pay | Admitting: *Deleted

## 2024-02-04 ENCOUNTER — Other Ambulatory Visit: Payer: Self-pay | Admitting: *Deleted

## 2024-02-04 DIAGNOSIS — Z860100 Personal history of colon polyps, unspecified: Secondary | ICD-10-CM

## 2024-02-04 MED ORDER — NA SULFATE-K SULFATE-MG SULF 17.5-3.13-1.6 GM/177ML PO SOLN: ORAL | 0 refills | Status: DC

## 2024-02-04 NOTE — Telephone Encounter (Signed)
 Pt answered no to both questions. She has been scheduled for 03/02/24 with Dr.Rourk. instructions mailed and prep sent to pharmacy.

## 2024-02-09 NOTE — Telephone Encounter (Signed)
 Questionnaire from recall, no referral needed

## 2024-02-17 ENCOUNTER — Other Ambulatory Visit: Payer: Self-pay | Admitting: Nurse Practitioner

## 2024-02-26 DIAGNOSIS — Z860100 Personal history of colon polyps, unspecified: Secondary | ICD-10-CM | POA: Diagnosis not present

## 2024-02-27 LAB — BASIC METABOLIC PANEL WITH GFR
BUN/Creatinine Ratio: 18 (ref 12–28)
BUN: 14 mg/dL (ref 8–27)
CO2: 25 mmol/L (ref 20–29)
Calcium: 9.6 mg/dL (ref 8.7–10.3)
Chloride: 99 mmol/L (ref 96–106)
Creatinine, Ser: 0.76 mg/dL (ref 0.57–1.00)
Glucose: 124 mg/dL — ABNORMAL HIGH (ref 70–99)
Potassium: 3.8 mmol/L (ref 3.5–5.2)
Sodium: 139 mmol/L (ref 134–144)
eGFR: 86 mL/min/1.73 (ref >59)

## 2024-02-29 ENCOUNTER — Encounter (HOSPITAL_COMMUNITY)
Admission: RE | Admit: 2024-02-29 | Discharge: 2024-02-29 | Disposition: A | Discharge status: Home / Self Care | Source: Ambulatory Visit | Attending: Internal Medicine | Admitting: Internal Medicine

## 2024-03-02 ENCOUNTER — Ambulatory Visit (HOSPITAL_COMMUNITY): Payer: Self-pay | Admitting: Certified Registered Nurse Anesthetist

## 2024-03-02 ENCOUNTER — Ambulatory Visit (HOSPITAL_BASED_OUTPATIENT_CLINIC_OR_DEPARTMENT_OTHER): Payer: Self-pay | Admitting: Certified Registered Nurse Anesthetist

## 2024-03-02 ENCOUNTER — Ambulatory Visit (HOSPITAL_COMMUNITY)
Admission: RE | Admit: 2024-03-02 | Discharge: 2024-03-02 | Disposition: A | Discharge status: Home / Self Care | Attending: Internal Medicine | Admitting: Internal Medicine

## 2024-03-02 ENCOUNTER — Encounter (HOSPITAL_COMMUNITY)
Admission: RE | Disposition: A | Discharge status: Home / Self Care | Payer: Self-pay | Source: Home / Self Care | Attending: Internal Medicine

## 2024-03-02 ENCOUNTER — Other Ambulatory Visit: Payer: Self-pay

## 2024-03-02 ENCOUNTER — Encounter (HOSPITAL_COMMUNITY): Payer: Self-pay | Admitting: Internal Medicine

## 2024-03-02 DIAGNOSIS — Z79899 Other long term (current) drug therapy: Secondary | ICD-10-CM | POA: Insufficient documentation

## 2024-03-02 DIAGNOSIS — K635 Polyp of colon: Secondary | ICD-10-CM | POA: Diagnosis not present

## 2024-03-02 DIAGNOSIS — Z1211 Encounter for screening for malignant neoplasm of colon: Secondary | ICD-10-CM | POA: Diagnosis not present

## 2024-03-02 DIAGNOSIS — Z79890 Hormone replacement therapy (postmenopausal): Secondary | ICD-10-CM | POA: Diagnosis not present

## 2024-03-02 DIAGNOSIS — Z860101 Personal history of adenomatous and serrated colon polyps: Secondary | ICD-10-CM | POA: Diagnosis not present

## 2024-03-02 DIAGNOSIS — I10 Essential (primary) hypertension: Secondary | ICD-10-CM | POA: Diagnosis not present

## 2024-03-02 DIAGNOSIS — E039 Hypothyroidism, unspecified: Secondary | ICD-10-CM

## 2024-03-02 DIAGNOSIS — D123 Benign neoplasm of transverse colon: Secondary | ICD-10-CM | POA: Diagnosis not present

## 2024-03-02 DIAGNOSIS — K5730 Diverticulosis of large intestine without perforation or abscess without bleeding: Secondary | ICD-10-CM | POA: Diagnosis not present

## 2024-03-02 HISTORY — PX: COLONOSCOPY: SHX5424

## 2024-03-02 SURGERY — COLONOSCOPY
Anesthesia: General

## 2024-03-02 MED ORDER — LACTATED RINGERS IV SOLN: INTRAVENOUS | Status: DC | PRN

## 2024-03-02 MED ORDER — LACTATED RINGERS IV SOLN: INTRAVENOUS | Status: DC

## 2024-03-02 MED ORDER — PROPOFOL 500 MG/50ML IV EMUL
INTRAVENOUS | Status: DC | PRN
  Administered 2024-03-02: 30 mg via INTRAVENOUS
  Administered 2024-03-02: 150 ug/kg/min via INTRAVENOUS

## 2024-03-02 MED ORDER — LIDOCAINE 2% (20 MG/ML) 5 ML SYRINGE
INTRAMUSCULAR | Status: DC | PRN
  Administered 2024-03-02: 50 mg via INTRAVENOUS

## 2024-03-02 MED ORDER — PROPOFOL 500 MG/50ML IV EMUL
INTRAVENOUS | Status: AC
  Filled 2024-03-02: qty 50

## 2024-03-02 NOTE — Transfer of Care (Signed)
 Immediate Anesthesia Transfer of Care Note  Patient: Katie Ingram  Procedure(s) Performed: COLONOSCOPY  Patient Location: Short Stay  Anesthesia Type:General  Level of Consciousness: awake, alert , and oriented  Airway & Oxygen Therapy: Patient Spontanous Breathing  Post-op Assessment: Report given to RN, Post -op Vital signs reviewed and stable, Patient moving all extremities X 4, and Patient able to stick tongue midline  Post vital signs: Reviewed and stable  Last Vitals:  Vitals Value Taken Time  BP 109/61   Temp 97.7   Pulse 65   Resp 13   SpO2 99     Last Pain:  Vitals:   03/02/24 0913  TempSrc:   PainSc: 0-No pain         Complications: No notable events documented.

## 2024-03-02 NOTE — Discharge Instructions (Addendum)
  Colonoscopy Discharge Instructions  Read the instructions outlined below and refer to this sheet in the next few weeks. These discharge instructions provide you with general information on caring for yourself after you leave the hospital. Your doctor may also give you specific instructions. While your treatment has been planned according to the most current medical practices available, unavoidable complications occasionally occur. If you have any problems or questions after discharge, call Dr. Shaaron at [phone]. ACTIVITY You may resume your regular activity, but move at a slower pace for the next 24 hours.  Take frequent rest periods for the next 24 hours.  Walking will help get rid of the air and reduce the bloated feeling in your belly (abdomen).  No driving for 24 hours (because of the medicine (anesthesia) used during the test).   Do not sign any important legal documents or operate any machinery for 24 hours (because of the anesthesia used during the test).  NUTRITION Drink plenty of fluids.  You may resume your normal diet as instructed by your doctor.  Begin with a light meal and progress to your normal diet. Heavy or fried foods are harder to digest and may make you feel sick to your stomach (nauseated).  Avoid alcoholic beverages for 24 hours or as instructed.  MEDICATIONS You may resume your normal medications unless your doctor tells you otherwise.  WHAT YOU CAN EXPECT TODAY Some feelings of bloating in the abdomen.  Passage of more gas than usual.  Spotting of blood in your stool or on the toilet paper.  IF YOU HAD POLYPS REMOVED DURING THE COLONOSCOPY: No aspirin products for 7 days or as instructed.  No alcohol for 7 days or as instructed.  Eat a soft diet for the next 24 hours.  FINDING OUT THE RESULTS OF YOUR TEST Not all test results are available during your visit. If your test results are not back during the visit, make an appointment with your caregiver to find out the  results. Do not assume everything is normal if you have not heard from your caregiver or the medical facility. It is important for you to follow up on all of your test results.  SEEK IMMEDIATE MEDICAL ATTENTION IF: You have more than a spotting of blood in your stool.  Your belly is swollen (abdominal distention).  You are nauseated or vomiting.  You have a temperature over 101.  You have abdominal pain or discomfort that is severe or gets worse throughout the day.    1 polyp found and removed  Left-sided diverticulosis found  Information of colon polyp and diverticulosis provided  Further recommendations to follow pending review of pathology report  At patient request, I called Ifeoma Vallin at [phone] -  unable to make contact.

## 2024-03-02 NOTE — Anesthesia Preprocedure Evaluation (Signed)
 Anesthesia Evaluation  Patient identified by MRN, date of birth, ID band Patient awake    Reviewed: Allergy & Precautions, H&P , NPO status , Patient's Chart, lab work & pertinent test results, reviewed documented beta blocker date and time   Airway Mallampati: II  TM Distance: >3 FB Neck ROM: full    Dental no notable dental hx.    Pulmonary neg pulmonary ROS   Pulmonary exam normal breath sounds clear to auscultation       Cardiovascular Exercise Tolerance: Good hypertension, negative cardio ROS + Valvular Problems/Murmurs  Rhythm:regular Rate:Normal     Neuro/Psych negative neurological ROS  negative psych ROS   GI/Hepatic negative GI ROS, Neg liver ROS,,,  Endo/Other  negative endocrine ROSHypothyroidism    Renal/GU negative Renal ROS  negative genitourinary   Musculoskeletal   Abdominal   Peds  Hematology negative hematology ROS (+)   Anesthesia Other Findings   Reproductive/Obstetrics negative OB ROS                              Anesthesia Physical Anesthesia Plan  ASA: 2  Anesthesia Plan: General   Post-op Pain Management:    Induction:   PONV Risk Score and Plan: Propofol  infusion  Airway Management Planned:   Additional Equipment:   Intra-op Plan:   Post-operative Plan:   Informed Consent: I have reviewed the patients History and Physical, chart, labs and discussed the procedure including the risks, benefits and alternatives for the proposed anesthesia with the patient or authorized representative who has indicated his/her understanding and acceptance.     Dental Advisory Given  Plan Discussed with: CRNA  Anesthesia Plan Comments:         Anesthesia Quick Evaluation

## 2024-03-02 NOTE — H&P (Signed)
 @LOGO @   Gastroenterology Progress Note    Primary Care Physician:  Cook, Jayce G, DO Primary Gastroenterologist:  Dr. Shaaron  Pre-Procedure History & Physical: HPI:  Katie Ingram is a 68 y.o. female here for  surveillance colonoscopy history of multiple colonic adenomas removed in 2020.  Past Medical History:  Diagnosis Date   Allergic rhinitis    Elevated BP without diagnosis of hypertension 07/09/2023   H/O goiter    Hypertension    Hypothyroidism    Seasonal allergies     Past Surgical History:  Procedure Laterality Date   COLONOSCOPY     COLONOSCOPY N/A 01/05/2019   Procedure: COLONOSCOPY;  Surgeon: Shaaron Lamar HERO, MD;  [facility]: [facility];  Service: Endoscopy;  Laterality: N/A;  10:30   DILATION AND CURETTAGE OF UTERUS     ENDOMETRIAL ABLATION     ESOPHAGOGASTRODUODENOSCOPY     POLYPECTOMY  01/05/2019   Procedure: POLYPECTOMY;  Surgeon: Shaaron Lamar HERO, MD;  Location: [facility];  Service: Endoscopy;;  splenic flexurex2   THYROID  SURGERY     TONSILLECTOMY AND ADENOIDECTOMY     TUBAL LIGATION      Prior to Admission medications   Medication Sig Start Date End Date Taking? Authorizing Provider  amLODipine  (NORVASC ) 2.5 MG tablet Take 1 tablet (2.5 mg total) by mouth daily. 01/08/24  Yes Mauro Elveria BROCKS, NP  Bacillus Coagulans-Inulin (ALIGN PREBIOTIC-PROBIOTIC PO) Take 1 capsule by mouth daily. One daily   Yes [provider]  cetirizine (ZYRTEC) 10 MG tablet Take 10 mg by mouth daily.   Yes [provider]  doxycycline  (VIBRAMYCIN ) 100 MG capsule Take 1 capsule (100 mg total) by mouth 2 (two) times daily. 01/28/24  Yes Stuart Vernell Norris, PA-C  FAMOTIDINE  PO Take 20 mg by mouth daily as needed.   Yes [provider]  fish oil-omega-3 fatty acids 1000 MG capsule Take 2 g by mouth daily.   Yes [provider]  fluticasone  (FLONASE) 50 MCG/ACT nasal spray USE 2 SPRAYS IN EACH       NOSTRIL DAILY AS NEEDED 10/01/15  Yes Alphonsa Elsie RAMAN, MD  Glucosamine-Chondroitin (MOVE FREE PO) Take 1 tablet by mouth daily.   Yes [provider]  hydrochlorothiazide  (HYDRODIURIL ) 25 MG tablet Take 1 tablet (25 mg total) by mouth daily. 02/17/24  Yes Cook, Jayce G, DO  levothyroxine  (SYNTHROID ) 50 MCG tablet TAKE (1) TABLET BY MOUTH ONCE DAILY. 11/26/23  Yes Mauro Elveria BROCKS, NP  MAGNESIUM GLYCINATE PO  11/22/23  Yes [provider]  montelukast  (SINGULAIR ) 10 MG tablet TAKE (1) TABLET BY MOUTH ONCE DAILY. 11/26/23  Yes Mauro Elveria BROCKS, NP  Multiple Vitamin (MULTIVITAMIN) tablet Take 1 tablet by mouth daily.   Yes [provider]  Na Sulfate-K Sulfate-Mg Sulfate concentrate (SUPREP) 17.5-3.13-1.6 GM/177ML SOLN As directed 02/04/24  Yes Johni Narine, Lamar HERO, MD  psyllium (METAMUCIL) 58.6 % powder Take 1 packet by mouth daily.   Yes [provider]  rosuvastatin  (CRESTOR ) 5 MG tablet TAKE (1) TABLET BY MOUTH DAILY ON MONDAY, WEDNESDAY AND FRIDAY. 11/26/23  Yes Mauro Elveria BROCKS, NP    Allergies as of 02/04/2024 - Review Complete 01/28/2024  Allergen Reaction Noted   Bactrim [sulfamethoxazole-trimethoprim] Itching 09/30/2012   Biaxin [clarithromycin]  09/30/2012   Ceftin [cefuroxime axetil]  09/30/2012   Compazine [prochlorperazine edisylate]  09/30/2012   Floxacillin [floxacillin (flucloxacillin)]  09/30/2012   Levaquin  [levofloxacin  in d5w]  04/07/2016   Tobramycin  09/30/2012    Family History  Problem Relation Age of Onset   Hypertension Mother    Arthritis Mother    Endometrial cancer Mother    Hypertension Father    Hyperlipidemia Father    Hypertension Brother    Arthritis Maternal Grandmother    Cancer Other        colon   Colon cancer Other     Social History   Socioeconomic History   Marital status: Married    Spouse name: Not on file   Number of children: Not on file   Years of education: Not on file   Highest education level: Bachelor's degree (e.g., BA, AB, BS)   Occupational History   Not on file  Tobacco Use   Smoking status: Never    Passive exposure: Never   Smokeless tobacco: Never  Vaping Use   Vaping status: Never Used  Substance and Sexual Activity   Alcohol use: Yes    Alcohol/week: 0.0 standard drinks of alcohol    Comment: occas social   Drug use: No   Sexual activity: Yes    Birth control/protection: Surgical, Post-menopausal  Other Topics Concern   Not on file  Social History Narrative   Not on file   Social Drivers of Health   Financial Resource Strain: Low Risk  (01/04/2024)   Overall Financial Resource Strain (CARDIA)    Difficulty of Paying Living Expenses: Not hard at all  Food Insecurity: No Food Insecurity (01/04/2024)   Hunger Vital Sign    Worried About Running Out of Food in the Last Year: Never true    Ran Out of Food in the Last Year: Never true  Transportation Needs: No Transportation Needs (01/04/2024)   PRAPARE - Administrator, Civil Service (Medical): No    Lack of Transportation (Non-Medical): No  Physical Activity: Sufficiently Active (01/04/2024)   Exercise Vital Sign    Days of Exercise per Week: 6 days    Minutes of Exercise per Session: 50 min  Stress: Stress Concern Present (01/04/2024)   [employer] of Occupational Health - Occupational Stress Questionnaire    Feeling of Stress: To some extent  Social Connections: Socially Integrated (01/04/2024)   Social Connection and Isolation Panel    Frequency of Communication with Friends and Family: More than three times a week    Frequency of Social Gatherings with Friends and Family: Once a week    Attends Religious Services: More than 4 times per year    Active Member of [employer] or Organizations: Yes    Attends Engineer, structural: More than 4 times per year    Marital Status: Married  Catering manager Violence: Not on file    Review of Systems   See HPI, otherwise negative ROS  Physical Exam: BP (!) 147/98   Pulse 77    Resp 16   Ht 5' 7 (1.702 m)   Wt 73.5 kg   SpO2 98%   BMI 25.38 kg/m  General:   Alert,  Well-developed, well-nourished, pleasant and cooperative in NAD enopathy. Lungs:  Clear throughout to auscultation.   No wheezes, crackles, or rhonchi. No acute distress. Heart:  Regular rate and rhythm; no murmurs, clicks, rubs,  or gallops. Abdomen: Non-distended, normal bowel sounds.  Soft and nontender without appreciable mass or hepatosplenomegaly.   Impression/Plan:      68 year old lady here for surveillance colonoscopy.  History of colonic polyps.  The risks, benefits, limitations, alternatives and imponderables have been reviewed with the patient. Questions have been answered.  All parties are agreeable.     Notice: This dictation was prepared with Dragon dictation along with smaller phrase technology. Any transcriptional errors that result from this process are unintentional and may not be corrected upon review.

## 2024-03-02 NOTE — Op Note (Signed)
 [facility] Patient Name: Katie Ingram Procedure Date: 03/02/2024 8:51 AM MRN: [id number] Date of Birth: 09/05/1955 Attending MD: Lamar Ozell Hollingshead , MD, [id number] CSN: [id number] Age: 68 Admit Type: Outpatient Procedure:                Colonoscopy Indications:              High risk colon cancer surveillance: Personal                            history of colonic polyps Providers:                Lamar Ozell Hollingshead, MD, Jon LABOR. Gerome RN, RN,                            Dorcas Lenis, Technician Referring MD:              Medicines:                Propofol  per Anesthesia Complications:            No immediate complications. Estimated Blood Loss:     Estimated blood loss was minimal. Procedure:                Pre-Anesthesia Assessment:                           - Prior to the procedure, a History and Physical                            was performed, and patient medications and                            allergies were reviewed. The patient's tolerance of                            previous anesthesia was also reviewed. The risks                            and benefits of the procedure and the sedation                            options and risks were discussed with the patient.                            All questions were answered, and informed consent                            was obtained. Prior Anticoagulants: The patient has                            taken no anticoagulant or antiplatelet agents. ASA                            Grade Assessment: III - A patient with severe  systemic disease. After reviewing the risks and                            benefits, the patient was deemed in satisfactory                            condition to undergo the procedure.                           After obtaining informed consent, the colonoscope                            was passed under direct vision. Throughout the                             procedure, the patient's blood pressure, pulse, and                            oxygen saturations were monitored continuously. The                            [id number] ([id number]) Colon was introduced through                            the anus and advanced to the the cecum, identified                            by appendiceal orifice and ileocecal valve. The                            colonoscopy was performed without difficulty. The                            patient tolerated the procedure well. The quality                            of the bowel preparation was adequate. The                            ileocecal valve, appendiceal orifice, and rectum                            were photographed. Scope In: 9:18:04 AM Scope Out: 9:35:22 AM Scope Withdrawal Time: 0 hours 9 minutes 55 seconds  Total Procedure Duration: 0 hours 17 minutes 18 seconds  Findings:      The perianal and digital rectal examinations were normal.      A few medium-mouthed diverticula were found in the sigmoid colon.      A 4 mm polyp was found in the mid transverse colon. The polyp was       sessile. The polyp was removed with a cold snare. Resection and       retrieval were complete. Estimated blood loss was minimal.      The exam was otherwise without abnormality on direct and retroflexion  views. Impression:               - Diverticulosis in the sigmoid colon.                           - One 4 mm polyp in the mid transverse colon,                            removed with a cold snare. Resected and retrieved.                           - The examination was otherwise normal on direct                            and retroflexion views. Moderate Sedation:      Moderate (conscious) sedation was personally administered by an       anesthesia professional. The following parameters were monitored: oxygen       saturation, heart rate, blood pressure, respiratory rate, EKG, adequacy       of pulmonary ventilation, and  response to care. Recommendation:           - Patient has a contact number available for                            emergencies. The signs and symptoms of potential                            delayed complications were discussed with the                            patient. Return to normal activities tomorrow.                            Written discharge instructions were provided to the                            patient.                           - Advance diet as tolerated.                           - Continue present medications.                           - Repeat colonoscopy date to be determined after                            pending pathology results are reviewed for                            surveillance based on pathology results.                           - Return to GI office (date not yet determined). Procedure Code(s):        ---  Professional ---                           [phone], Colonoscopy, flexible; with removal of                            tumor(s), polyp(s), or other lesion(s) by snare                            technique Diagnosis Code(s):        --- Professional ---                           Z86.010, Personal history of colonic polyps                           D12.3, Benign neoplasm of transverse colon (hepatic                            flexure or splenic flexure)                           K57.30, Diverticulosis of large intestine without                            perforation or abscess without bleeding CPT copyright 2022 [employer]. All rights reserved. The codes documented in this report are preliminary and upon coder review may  be revised to meet current compliance requirements. Lamar HERO. Ubah Radke, MD Lamar Ozell Hollingshead, MD 03/02/2024 9:40:28 AM This report has been signed electronically. Number of Addenda: 0

## 2024-03-03 ENCOUNTER — Ambulatory Visit (INDEPENDENT_AMBULATORY_CARE_PROVIDER_SITE_OTHER): Admitting: Nurse Practitioner

## 2024-03-03 ENCOUNTER — Ambulatory Visit: Payer: Self-pay | Admitting: Internal Medicine

## 2024-03-03 ENCOUNTER — Encounter (HOSPITAL_COMMUNITY): Payer: Self-pay | Admitting: Internal Medicine

## 2024-03-03 VITALS — BP 122/70 | HR 77 | Wt 163.0 lb

## 2024-03-03 DIAGNOSIS — E7849 Other hyperlipidemia: Secondary | ICD-10-CM

## 2024-03-03 DIAGNOSIS — I10 Essential (primary) hypertension: Secondary | ICD-10-CM

## 2024-03-03 DIAGNOSIS — R7301 Impaired fasting glucose: Secondary | ICD-10-CM | POA: Diagnosis not present

## 2024-03-03 LAB — SURGICAL PATHOLOGY

## 2024-03-03 MED ORDER — AMLODIPINE BESYLATE 10 MG PO TABS: 10.0000 mg | ORAL_TABLET | Freq: Every day | ORAL | 1 refills | Status: DC

## 2024-03-03 NOTE — Anesthesia Postprocedure Evaluation (Signed)
 Anesthesia Post Note  Patient: Katie Ingram  Procedure(s) Performed: COLONOSCOPY  Patient location during evaluation: Phase II Anesthesia Type: General Level of consciousness: awake Pain management: pain level controlled Vital Signs Assessment: post-procedure vital signs reviewed and stable Respiratory status: spontaneous breathing and respiratory function stable Cardiovascular status: blood pressure returned to baseline and stable Postop Assessment: no headache and no apparent nausea or vomiting Anesthetic complications: no Comments: Late entry   No notable events documented.   Last Vitals:  Vitals:   03/02/24 0941 03/02/24 0947  BP: 109/61 124/64  Pulse: 66   Resp: 16   Temp: 36.5 C   SpO2: 100%     Last Pain:  Vitals:   03/03/24 1528  TempSrc:   PainSc: 0-No pain                 [name]

## 2024-03-05 ENCOUNTER — Encounter: Payer: Self-pay | Admitting: Nurse Practitioner

## 2024-03-05 NOTE — Progress Notes (Signed)
 Subjective:    Patient ID: Katie Ingram, female    DOB: [birth year], 68 y.o.   MRN: [id number]  Hypertension Pertinent negatives include no chest pain or shortness of breath.   Discussed the use of AI scribe software for clinical note transcription with the patient, who gave verbal consent to proceed.  History of Present Illness Katie Ingram is a 68 year old female with hypertension who presents for blood pressure management.  She has a history of hypertension and is currently experiencing elevated blood pressure readings consistently above 140 mmHg, despite taking amlodipine  5 mg daily. She has previously tried hydrochlorothiazide  without the desired effect on her blood pressure.  She leads a healthy lifestyle, including regular walking and maintaining a healthy diet. However, she reports recent stressors, particularly related to family dynamics. She has set boundaries with her family to help manage her stress.  She denies chest pain, shortness of breath, and leg swelling. She notes occasional swelling in her hands, particularly during the summer months.  Family history includes hypertension affecting her brother, mother, and father.   Review of Systems  Respiratory:  Negative for cough, chest tightness and shortness of breath.   Cardiovascular:  Negative for chest pain and leg swelling.      03/03/2024    1:10 PM  Depression screen PHQ 2/9  Decreased Interest 0  Down, Depressed, Hopeless 0  PHQ - 2 Score 0  Altered sleeping 0  Tired, decreased energy 0  Change in appetite 0  Feeling bad or failure about yourself  0  Trouble concentrating 0  Moving slowly or fidgety/restless 0  Suicidal thoughts 0  PHQ-9 Score 0  Difficult doing work/chores Not difficult at all      03/03/2024    1:10 PM 01/13/2024    3:13 PM 01/08/2024    8:59 AM 11/26/2023    9:37 AM  GAD 7 : Generalized Anxiety Score  Nervous, Anxious, on Edge 0 0 1 0  Control/stop worrying 0 0 1 0  Worry too much -  different things 0 0 1 0  Trouble relaxing 0 0 1 0  Restless 0 0 0 0  Easily annoyed or irritable 0 0 1 0  Afraid - awful might happen 0 0 0 0  Total GAD 7 Score 0 0 5 0  Anxiety Difficulty Not difficult at all  Not difficult at all     Social History   Tobacco Use   Smoking status: Never    Passive exposure: Never   Smokeless tobacco: Never  Vaping Use   Vaping status: Never Used  Substance Use Topics   Alcohol use: Yes    Alcohol/week: 0.0 standard drinks of alcohol    Comment: occas social   Drug use: No        Objective:   Physical Exam Vitals and nursing note reviewed.  Constitutional:      General: She is not in acute distress. Cardiovascular:     Rate and Rhythm: Normal rate and regular rhythm.     Heart sounds: Normal heart sounds.  Pulmonary:     Effort: Pulmonary effort is normal.     Breath sounds: Normal breath sounds.  Musculoskeletal:     Right lower leg: No edema.     Left lower leg: No edema.  Neurological:     Mental Status: She is alert and oriented to person, place, and time.  Psychiatric:        Mood and Affect: Mood normal.  Behavior: Behavior normal.        Thought Content: Thought content normal.        Judgment: Judgment normal.    Today's Vitals   03/03/24 1304  BP: 122/70  Pulse: 77  SpO2: 98%  Weight: 163 lb (73.9 kg)   Body mass index is 25.53 kg/m.        Assessment & Plan:  1. Primary hypertension (Primary) Blood pressure above 130/80 mmHg despite lifestyle changes and amlodipine  5 mg. Family history and stress may contribute. Current dose insufficient. - Increase amlodipine  to 10 mg daily. - Monitor blood pressure at home. - Watch for leg swelling. - Discontinue hydrochlorothiazide . - Continue healthy lifestyle. Discussed stress reduction.  - Follow up in 3 months if controlled or send MyChart message if no improvement. - amLODipine  (NORVASC ) 10 MG tablet; Take 1 tablet (10 mg total) by mouth daily.   Dispense: 90 tablet; Refill: 1  2. Other hyperlipidemia  - Lipid panel  3. Elevated fasting glucose  - Hemoglobin A1c  Return in about 3 months (around 06/02/2024).

## 2024-03-23 DIAGNOSIS — H04123 Dry eye syndrome of bilateral lacrimal glands: Secondary | ICD-10-CM | POA: Diagnosis not present

## 2024-03-23 DIAGNOSIS — H5203 Hypermetropia, bilateral: Secondary | ICD-10-CM | POA: Diagnosis not present

## 2024-03-23 DIAGNOSIS — H52223 Regular astigmatism, bilateral: Secondary | ICD-10-CM | POA: Diagnosis not present

## 2024-05-10 ENCOUNTER — Encounter: Payer: Self-pay | Admitting: Nurse Practitioner

## 2024-05-13 ENCOUNTER — Other Ambulatory Visit: Payer: Self-pay | Admitting: Nurse Practitioner

## 2024-05-13 MED ORDER — VALSARTAN 80 MG PO TABS: 80.0000 mg | ORAL_TABLET | Freq: Every day | ORAL | 1 refills | Status: AC

## 2024-05-16 ENCOUNTER — Telehealth: Payer: Self-pay | Admitting: *Deleted

## 2024-05-16 NOTE — Telephone Encounter (Unsigned)
 Copied from CRM #[id number]. Topic: Clinical - Prescription Issue >> May 16, 2024 10:25 AM Janeecia G wrote: Reason for CRM: pt wanted to let Dr.Carolyn know that she do agree with the medication she advised her to take and wants them sent in [facility].

## 2024-05-17 ENCOUNTER — Other Ambulatory Visit: Payer: Self-pay | Admitting: Nurse Practitioner

## 2024-05-17 ENCOUNTER — Encounter: Payer: Self-pay | Admitting: Nurse Practitioner

## 2024-05-17 NOTE — Telephone Encounter (Signed)
 Done

## 2024-05-27 DIAGNOSIS — R7301 Impaired fasting glucose: Secondary | ICD-10-CM | POA: Diagnosis not present

## 2024-05-27 DIAGNOSIS — E7849 Other hyperlipidemia: Secondary | ICD-10-CM | POA: Diagnosis not present

## 2024-05-28 LAB — LIPID PANEL
Chol/HDL Ratio: 3 ratio (ref 0.0–4.4)
Cholesterol, Total: 169 mg/dL (ref 100–199)
HDL: 57 mg/dL
LDL Chol Calc (NIH): 97 mg/dL (ref 0–99)
Triglycerides: 80 mg/dL (ref 0–149)
VLDL Cholesterol Cal: 15 mg/dL (ref 5–40)

## 2024-05-28 LAB — HEMOGLOBIN A1C
Est. average glucose Bld gHb Est-mCnc: 120 mg/dL
Hgb A1c MFr Bld: 5.8 % — ABNORMAL HIGH (ref 4.8–5.6)

## 2024-05-31 ENCOUNTER — Encounter: Payer: Self-pay | Admitting: Nurse Practitioner

## 2024-05-31 ENCOUNTER — Ambulatory Visit: Admitting: Nurse Practitioner

## 2024-05-31 VITALS — BP 136/88 | HR 82 | Temp 97.0°F | Ht 67.0 in | Wt 160.0 lb

## 2024-05-31 DIAGNOSIS — E7849 Other hyperlipidemia: Secondary | ICD-10-CM | POA: Diagnosis not present

## 2024-05-31 DIAGNOSIS — I10 Essential (primary) hypertension: Secondary | ICD-10-CM

## 2024-05-31 NOTE — Progress Notes (Signed)
 "  Subjective:    Patient ID: Katie Ingram, female    DOB: [birth year], 68 y.o.   MRN: [id number]  HPI 3 month follow up HTN no concerns voiced   Review of Systems    Discussed the use of AI scribe software for clinical note transcription with the patient, who gave verbal consent to proceed.  History of Present Illness Delma Drone is a 69 year old female with hypertension who presents for a follow-up visit.  She has a history of hypertension and recently experienced ankle swelling, which she associates with amlodipine  use. She discontinued amlodipine  and is now taking valsartan  and hydrochlorothiazide  for blood pressure management. She reports some blood pressure readings as low as 122/72.  She has a family history of hypertension, with her brother also taking valsartan  and her mother on lisinopril, amlodipine , and another unspecified medication for hypertension.  Her cholesterol levels are excellent, well below 100. Her A1c has increased slightly to 5.7. She attributes this to recent holiday eating and family gatherings. She is very active, aiming for a minimum of 10,000 steps a day, and has recently lost three pounds. She is mindful of her diet, particularly salt intake, and has been trying to walk after meals to help regulate her blood sugar.  She experiences dry eyes, which have been bothersome. Followed by an eye specialist. She has tried various treatments, including Blink supplements and Refresh eye drops, but has not found significant relief.  No chest pain, shortness of breath, or cough.     05/31/2024    8:59 AM  Depression screen PHQ 2/9  Decreased Interest 0  Down, Depressed, Hopeless 0  PHQ - 2 Score 0  Altered sleeping 1  Tired, decreased energy 1  Change in appetite 0  Feeling bad or failure about yourself  0  Trouble concentrating 0  Moving slowly or fidgety/restless 0  Suicidal thoughts 0  PHQ-9 Score 2  Difficult doing work/chores Not difficult at all       05/31/2024    8:59 AM 03/03/2024    1:10 PM 01/13/2024    3:13 PM 01/08/2024    8:59 AM  GAD 7 : Generalized Anxiety Score  Nervous, Anxious, on Edge 0 0 0 1  Control/stop worrying 0 0 0 1  Worry too much - different things 0 0 0 1  Trouble relaxing 0 0 0 1  Restless 0 0 0 0  Easily annoyed or irritable 0 0 0 1  Afraid - awful might happen 0 0 0 0  Total GAD 7 Score 0 0 0 5  Anxiety Difficulty Not difficult at all Not difficult at all  Not difficult at all    Social History[1]  Objective:   Physical Exam NAD.  Alert, oriented.  Calm cheerful affect.  Lungs clear.  Heart regular rate rhythm.  Lower extremities no edema.  Results for orders placed or performed in visit on 03/03/24  Lipid panel   Collection Time: 05/27/24 10:36 AM  Result Value Ref Range   Cholesterol, Total 169 100 - 199 mg/dL   Triglycerides 80 0 - 149 mg/dL   HDL 57 >60 mg/dL   VLDL Cholesterol Cal 15 5 - 40 mg/dL   LDL Chol Calc (NIH) 97 0 - 99 mg/dL   Chol/HDL Ratio 3.0 0.0 - 4.4 ratio  Hemoglobin A1c   Collection Time: 05/27/24 10:36 AM  Result Value Ref Range   Hgb A1c MFr Bld 5.8 (H) 4.8 - 5.6 %   Est. average  glucose Bld gHb Est-mCnc 120 mg/dL   Labs reviewed with patient today during visit.  Today's Vitals   05/31/24 0859  BP: 136/88  Pulse: 82  Temp: (!) 97 F (36.1 C)  SpO2: 98%  Weight: 160 lb (72.6 kg)  Height: 5' 7 (1.702 m)   Body mass index is 25.06 kg/m.      Assessment & Plan:  1. Primary hypertension (Primary) Blood pressure controlled with valsartan  and hydrochlorothiazide . Amlodipine  discontinued due to ankle swelling. Current readings in 120s/70s. Goal: maintain <130/80 mmHg. - Continue valsartan  and hydrochlorothiazide . - Monitor blood pressure regularly. - Contact provider if significant increase.  - hydrochlorothiazide  (HYDRODIURIL ) 25 MG tablet; Take 1 tablet (25 mg total) by mouth daily.  Dispense: 90 tablet; Refill: 1  2. Other hyperlipidemia Cholesterol  well-controlled with rosuvastatin , levels <100 mg/dL. - Continue rosuvastatin  as prescribed.  A1c at 5.8%. No medication needed. Emphasized lifestyle modifications. - Monitor A1c with annual labs. - Encouraged lifestyle modifications, including reducing sugar and simple carbohydrate intake. - Encouraged regular physical activity, aiming for at least 10,000 steps per day. Recheck A1C with next labs in 6 months.   Return in about 6 months (around 11/29/2024).       [1]  Social History Tobacco Use   Smoking status: Never    Passive exposure: Never   Smokeless tobacco: Never  Vaping Use   Vaping status: Never Used  Substance Use Topics   Alcohol use: Yes    Alcohol/week: 0.0 standard drinks of alcohol    Comment: occas social   Drug use: No   "

## 2024-06-01 ENCOUNTER — Encounter: Payer: Self-pay | Admitting: Nurse Practitioner

## 2024-06-01 MED ORDER — HYDROCHLOROTHIAZIDE 25 MG PO TABS: 25.0000 mg | ORAL_TABLET | Freq: Every day | ORAL | 1 refills | Status: AC

## 2024-08-25 ENCOUNTER — Ambulatory Visit

## 2024-11-28 ENCOUNTER — Encounter: Admitting: Nurse Practitioner

## 2025-01-13 ENCOUNTER — Ambulatory Visit: Admitting: Rheumatology
